# Patient Record
Sex: Male | Born: 1960
Health system: Southern US, Community
[De-identification: ages and names within clinical notes are randomized; demographics above are authoritative.]

## PROBLEM LIST (undated history)

## (undated) DIAGNOSIS — R51 Headache: Secondary | ICD-10-CM

## (undated) DIAGNOSIS — R519 Headache, unspecified: Secondary | ICD-10-CM

## (undated) DIAGNOSIS — K219 Gastro-esophageal reflux disease without esophagitis: Secondary | ICD-10-CM

## (undated) HISTORY — PX: KIDNEY STONE SURGERY: SHX686

---
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
## 1990-11-23 HISTORY — PX: VASECTOMY: SHX75

## 2006-07-22 ENCOUNTER — Ambulatory Visit: Payer: Self-pay | Admitting: Urology

## 2010-04-01 ENCOUNTER — Ambulatory Visit: Payer: Self-pay | Admitting: Gastroenterology

## 2010-04-01 LAB — HM COLONOSCOPY: HM Colonoscopy: NORMAL

## 2010-11-03 ENCOUNTER — Ambulatory Visit: Payer: Self-pay | Admitting: General Surgery

## 2010-11-03 HISTORY — PX: HERNIA REPAIR: SHX51

## 2011-11-24 HISTORY — PX: COLONOSCOPY: SHX174

## 2015-08-14 ENCOUNTER — Encounter: Payer: Self-pay | Admitting: Family Medicine

## 2016-01-06 ENCOUNTER — Encounter: Payer: Self-pay | Admitting: Family Medicine

## 2016-01-06 ENCOUNTER — Ambulatory Visit (INDEPENDENT_AMBULATORY_CARE_PROVIDER_SITE_OTHER): Payer: BLUE CROSS/BLUE SHIELD | Admitting: Family Medicine

## 2016-01-06 VITALS — BP 116/76 | HR 62 | Temp 96.9°F | Ht 67.6 in | Wt 176.0 lb

## 2016-01-06 DIAGNOSIS — Z0000 Encounter for general adult medical examination without abnormal findings: Secondary | ICD-10-CM | POA: Diagnosis not present

## 2016-01-06 DIAGNOSIS — K4090 Unilateral inguinal hernia, without obstruction or gangrene, not specified as recurrent: Secondary | ICD-10-CM | POA: Diagnosis not present

## 2016-01-06 DIAGNOSIS — Z23 Encounter for immunization: Secondary | ICD-10-CM | POA: Diagnosis not present

## 2016-01-06 DIAGNOSIS — Z113 Encounter for screening for infections with a predominantly sexual mode of transmission: Secondary | ICD-10-CM | POA: Diagnosis not present

## 2016-01-06 LAB — UA/M W/RFLX CULTURE, ROUTINE
Bilirubin, UA: NEGATIVE
Glucose, UA: NEGATIVE
Ketones, UA: NEGATIVE
Leukocytes, UA: NEGATIVE
Nitrite, UA: NEGATIVE
Protein, UA: NEGATIVE
RBC, UA: NEGATIVE
Specific Gravity, UA: 1.025 (ref 1.005–1.030)
Urobilinogen, Ur: 0.2 mg/dL (ref 0.2–1.0)
pH, UA: 6 (ref 5.0–7.5)

## 2016-01-06 NOTE — Progress Notes (Signed)
BP 116/76 mmHg  Pulse 62  Temp(Src) 96.9 F (36.1 C)  Ht 5' 7.6" (1.717 m)  Wt 176 lb (79.833 kg)  BMI 27.08 kg/m2  SpO2 98%   Subjective:    Patient ID: Thomas Whitney, male    DOB: [birth year], 55 y.o.   MRN: [id number]  HPI: Thomas Whitney is a 55 y.o. male presenting on 01/06/2016 for comprehensive medical examination. Current medical complaints include:   HERNIA Duration: months Location: left groin Painful: no Discomfort: no Bulge: yes Quality:  Mild aching Onset: gradual Context: not changing Aggravating factors: valsalva, lifting, coughing and sneezing  He currently lives with: spouse Interim Problems from his last visit: no  Depression Screen done today and results listed below:  Depression screen [facility] 2/9 01/06/2016  Decreased Interest 0  Down, Depressed, Hopeless 0  PHQ - 2 Score 0    The patient does not have a history of falls. I did not complete a risk assessment for falls. A plan of care for falls was not documented.  Past Medical History:  History reviewed. No pertinent past medical history.  Surgical History:  Past Surgical History  Procedure Laterality Date  . Tonsillectomy    . Vasectomy    . Hernia repair    . Kidney stone surgery      X 3   Medications:  No current outpatient prescriptions on file prior to visit.   No current facility-administered medications on file prior to visit.   Allergies:  No Known Allergies  Social History:  Social History   Social History  . Marital Status: Married    Spouse Name: N/A  . Number of Children: N/A  . Years of Education: N/A   Occupational History  . Not on file.   Social History Main Topics  . Smoking status: Former Games developer  . Smokeless tobacco: Current User    Types: Snuff  . Alcohol Use: Yes     Comment: on occasion  . Drug Use: No  . Sexual Activity: Yes   Other Topics Concern  . Not on file   Social History Narrative  . No narrative on file   History  Smoking status  .  Former Smoker  Smokeless tobacco  . Biochemist, clinical  . Types: Snuff   History  Alcohol Use  . Yes    Comment: on occasion   Family History:  Family History  Problem Relation Age of Onset  . Dementia Mother   . Hypertension Mother   . Diabetes Father   . Diabetes Maternal Grandfather   . Diabetes Paternal Grandfather     Past medical history, surgical history, medications, allergies, family history and social history reviewed with patient today and changes made to appropriate areas of the chart.   Review of Systems  Constitutional: Negative.   HENT: Negative.   Eyes: Negative.   Respiratory: Negative.   Cardiovascular: Negative.   Gastrointestinal: Positive for heartburn (goes away with prilosec). Negative for nausea, vomiting, abdominal pain, diarrhea, constipation, blood in stool and melena.  Genitourinary: Negative.   Musculoskeletal: Negative.   Skin: Negative.   Neurological: Negative.   Endo/Heme/Allergies: Negative.   Psychiatric/Behavioral: Negative.     All other ROS negative except what is listed above and in the HPI.      Objective:    BP 116/76 mmHg  Pulse 62  Temp(Src) 96.9 F (36.1 C)  Ht 5' 7.6" (1.717 m)  Wt 176 lb (79.833 kg)  BMI 27.08 kg/m2  SpO2 98%  Wt Readings from Last 3 Encounters:  01/06/16 176 lb (79.833 kg)    Physical Exam  Constitutional: He is oriented to person, place, and time. He appears well-developed and well-nourished. No distress.  HENT:  Head: Normocephalic and atraumatic.  Right Ear: Hearing, tympanic membrane, external ear and ear canal normal.  Left Ear: Hearing, tympanic membrane, external ear and ear canal normal.  Nose: Nose normal.  Mouth/Throat: Uvula is midline, oropharynx is clear and moist and mucous membranes are normal. No oropharyngeal exudate.  Eyes: Conjunctivae, EOM and lids are normal. Pupils are equal, round, and reactive to light. Right eye exhibits no discharge. Left eye exhibits no discharge. No  scleral icterus.  Neck: No JVD present. No tracheal deviation present. No thyromegaly present.  Cardiovascular: Normal rate, regular rhythm, normal heart sounds and intact distal pulses.  Exam reveals no gallop and no friction rub.   No murmur heard. Pulmonary/Chest: Effort normal and breath sounds normal. No stridor. No respiratory distress. He has no wheezes. He has no rales. He exhibits no tenderness.  Abdominal: Soft. Bowel sounds are normal. He exhibits no distension and no mass. There is no tenderness. There is no rebound and no guarding. A hernia is present. Hernia confirmed positive in the left inguinal area. Hernia confirmed negative in the right inguinal area.  Genitourinary: Testes normal and penis normal. No phimosis, paraphimosis, hypospadias, penile erythema or penile tenderness. No discharge found.  Musculoskeletal: Normal range of motion. He exhibits no edema or tenderness.  Lymphadenopathy:    He has no cervical adenopathy.       Right: No inguinal adenopathy present.       Left: No inguinal adenopathy present.  Neurological: He is alert and oriented to person, place, and time. He has normal reflexes. He displays normal reflexes. No cranial nerve deficit. He exhibits normal muscle tone. Coordination normal.  Skin: Skin is warm, dry and intact. No rash noted. He is not diaphoretic. No erythema. No pallor.  Psychiatric: He has a normal mood and affect. His speech is normal and behavior is normal. Judgment and thought content normal. Cognition and memory are normal.  Nursing note and vitals reviewed.   Results for orders placed or performed in visit on 01/06/16  HM COLONOSCOPY  Result Value Ref Range   HM Colonoscopy Normal, repeat 10years       Assessment & Plan:   Problem List Items Addressed This Visit      Other   Left inguinal hernia    Has seen Dr. Lemar Livings in the past for his R hernia repair. Will get him back in to see them for possible hernia repair. Referral  generated today.      Relevant Orders   Ambulatory referral to General Surgery    Other Visit Diagnoses    Routine general medical examination at a health care facility    -  Primary    Up to date on vaccines and colonoscopy. Screening labs checked today. Work on diet and exercise. Continue to monitor. Would like zoster next year.     Relevant Orders    CBC with Differential/Platelet    Comprehensive metabolic panel    Lipid Panel w/o Chol/HDL Ratio    TSH    PSA    UA/M w/rflx Culture, Routine    Screening for STD (sexually transmitted disease)        No risk. Drawn today. Await results.     Relevant Orders    HIV antibody    Immunization  due        Relevant Orders    Pneumococcal polysaccharide vaccine 23-valent greater than or equal to 2yo subcutaneous/IM (Completed)        Discussed aspirin prophylaxis for myocardial infarction prevention and decision was it was not indicated  LABORATORY TESTING:  Health maintenance labs ordered today as discussed above.   The natural history of prostate cancer and ongoing controversy regarding screening and potential treatment outcomes of prostate cancer has been discussed with the patient. The meaning of a false positive PSA and a false negative PSA has been discussed. He indicates understanding of the limitations of this screening test and wishes to proceed with screening PSA testing.   IMMUNIZATIONS:   - Tdap: Tetanus vaccination status reviewed: last tetanus booster within 10 years. - Influenza: Up to date - Pneumovax: Administered today - Prevnar: Not applicable - Zostavax vaccine: Will do it next year  SCREENING: - Colonoscopy: Up to date  Discussed with patient purpose of the colonoscopy is to detect colon cancer at curable precancerous or early stages   PATIENT COUNSELING:    Sexuality: Discussed sexually transmitted diseases, partner selection, use of condoms, avoidance of unintended pregnancy  and contraceptive  alternatives.   Advised to avoid cigarette smoking.  I discussed with the patient that most people either abstain from alcohol or drink within safe limits (<=14/week and <=4 drinks/occasion for males, <=7/weeks and <= 3 drinks/occasion for females) and that the risk for alcohol disorders and other health effects rises proportionally with the number of drinks per week and how often a drinker exceeds daily limits.  Discussed cessation/primary prevention of drug use and availability of treatment for abuse.   Diet: Encouraged to adjust caloric intake to maintain  or achieve ideal body weight, to reduce intake of dietary saturated fat and total fat, to limit sodium intake by avoiding high sodium foods and not adding table salt, and to maintain adequate dietary potassium and calcium preferably from fresh fruits, vegetables, and low-fat dairy products.    stressed the importance of regular exercise  Injury prevention: Discussed safety belts, safety helmets, smoke detector, smoking near bedding or upholstery.   Dental health: Discussed importance of regular tooth brushing, flossing, and dental visits.   Follow up plan: NEXT PREVENTATIVE PHYSICAL DUE IN 1 YEAR. Return in about 1 year (around 01/05/2017) for Physical.

## 2016-01-06 NOTE — Patient Instructions (Signed)

## 2016-01-06 NOTE — Assessment & Plan Note (Signed)
Has seen Dr. Lemar Livings in the past for his R hernia repair. Will get him back in to see them for possible hernia repair. Referral generated today.

## 2016-01-07 ENCOUNTER — Encounter: Payer: Self-pay | Admitting: Family Medicine

## 2016-01-07 LAB — LIPID PANEL W/O CHOL/HDL RATIO
Cholesterol, Total: 198 mg/dL (ref 100–199)
HDL: 66 mg/dL (ref >39)
LDL Calculated: 122 mg/dL — ABNORMAL HIGH (ref 0–99)
Triglycerides: 50 mg/dL (ref 0–149)
VLDL Cholesterol Cal: 10 mg/dL (ref 5–40)

## 2016-01-07 LAB — COMPREHENSIVE METABOLIC PANEL
ALT: 14 IU/L (ref 0–44)
AST: 17 IU/L (ref 0–40)
Albumin/Globulin Ratio: 1.8 (ref 1.1–2.5)
Albumin: 4.4 g/dL (ref 3.5–5.5)
Alkaline Phosphatase: 45 IU/L (ref 39–117)
BUN/Creatinine Ratio: 15 (ref 9–20)
BUN: 14 mg/dL (ref 6–24)
Bilirubin Total: 0.5 mg/dL (ref 0.0–1.2)
CO2: 26 mmol/L (ref 18–29)
Calcium: 9.1 mg/dL (ref 8.7–10.2)
Chloride: 103 mmol/L (ref 96–106)
Creatinine, Ser: 0.96 mg/dL (ref 0.76–1.27)
GFR calc Af Amer: 101 mL/min/{1.73_m2} (ref >59)
GFR calc non Af Amer: 87 mL/min/{1.73_m2} (ref >59)
Globulin, Total: 2.4 g/dL (ref 1.5–4.5)
Glucose: 110 mg/dL — ABNORMAL HIGH (ref 65–99)
Potassium: 4.9 mmol/L (ref 3.5–5.2)
Sodium: 144 mmol/L (ref 134–144)
Total Protein: 6.8 g/dL (ref 6.0–8.5)

## 2016-01-07 LAB — CBC WITH DIFFERENTIAL/PLATELET
Basophils Absolute: 0 10*3/uL (ref 0.0–0.2)
Basos: 1 %
EOS (ABSOLUTE): 0.1 10*3/uL (ref 0.0–0.4)
Eos: 2 %
Hematocrit: 42.4 % (ref 37.5–51.0)
Hemoglobin: 14.2 g/dL (ref 12.6–17.7)
Immature Grans (Abs): 0 10*3/uL (ref 0.0–0.1)
Immature Granulocytes: 0 %
Lymphocytes Absolute: 1.7 10*3/uL (ref 0.7–3.1)
Lymphs: 36 %
MCH: 31.1 pg (ref 26.6–33.0)
MCHC: 33.5 g/dL (ref 31.5–35.7)
MCV: 93 fL (ref 79–97)
Monocytes Absolute: 0.7 10*3/uL (ref 0.1–0.9)
Monocytes: 14 %
Neutrophils Absolute: 2.2 10*3/uL (ref 1.4–7.0)
Neutrophils: 47 %
Platelets: 258 10*3/uL (ref 150–379)
RBC: 4.56 x10E6/uL (ref 4.14–5.80)
RDW: 13.6 % (ref 12.3–15.4)
WBC: 4.6 10*3/uL (ref 3.4–10.8)

## 2016-01-07 LAB — PSA: Prostate Specific Ag, Serum: 0.8 ng/mL (ref 0.0–4.0)

## 2016-01-07 LAB — HIV ANTIBODY (ROUTINE TESTING W REFLEX): HIV Screen 4th Generation wRfx: NONREACTIVE

## 2016-01-07 LAB — TSH: TSH: 1.18 u[IU]/mL (ref 0.450–4.500)

## 2016-01-08 ENCOUNTER — Encounter: Payer: Self-pay | Admitting: General Surgery

## 2016-01-09 ENCOUNTER — Ambulatory Visit (INDEPENDENT_AMBULATORY_CARE_PROVIDER_SITE_OTHER): Payer: BLUE CROSS/BLUE SHIELD | Admitting: General Surgery

## 2016-01-09 ENCOUNTER — Encounter: Payer: Self-pay | Admitting: General Surgery

## 2016-01-09 VITALS — BP 120/78 | HR 74 | Resp 12 | Ht 68.0 in | Wt 186.0 lb

## 2016-01-09 DIAGNOSIS — K4090 Unilateral inguinal hernia, without obstruction or gangrene, not specified as recurrent: Secondary | ICD-10-CM

## 2016-01-09 NOTE — Progress Notes (Signed)
Patient ID: Thomas Whitney, male   DOB: Jul 04, 1961, 55 y.o.   MRN: [id number]  Chief Complaint  Patient presents with  . Hernia    HPI [name] is a 55 y.o. male.  Here today for evaluation of a possible left  inguinal hernia. He noticed this area about three years ago. No change in size, just started noting some pitching discomfort about a month ago. Right inguinal hernia repair done in 2011. No problems in this area.  The patient denies any new GI or GU symptoms.  I personally reviewed the patient's history.    HPI  No past medical history on file.  Past Surgical History  Procedure Laterality Date  . Tonsillectomy  1966  . Kidney stone surgery      2001, 2006  . Vasectomy  1992  . Colonoscopy  2013  . Hernia repair Right 11/03/2010    Direct inguinal hernia, large Ultra Pro mesh.    Family History  Problem Relation Age of Onset  . Dementia Mother   . Hypertension Mother   . Diabetes Father   . Diabetes Maternal Grandfather   . Diabetes Paternal Grandfather     Social History Social History  Substance Use Topics  . Smoking status: Former Smoker -- 20 years  . Smokeless tobacco: Current User    Types: Snuff  . Alcohol Use: 0.0 oz/week    0 Standard drinks or equivalent per week     Comment: on occasion    No Known Allergies  No current outpatient prescriptions on file.   No current facility-administered medications for this visit.    Review of Systems Review of Systems  Constitutional: Negative.   Respiratory: Negative.   Cardiovascular: Negative.     Blood pressure 120/78, pulse 74, resp. rate 12, height  (1.727 m), weight 186 lb (84.369 kg).  Physical Exam Physical Exam  Constitutional: He is oriented to person, place, and time. He appears well-developed and well-nourished.  Eyes: Conjunctivae are normal. No scleral icterus.  Neck: Neck supple.  Cardiovascular: Normal rate and normal heart sounds.   Pulmonary/Chest: Effort normal  and breath sounds normal.  Abdominal: Soft. Normal appearance. A hernia is present. Hernia confirmed positive in the left inguinal area.  Lymphadenopathy:    He has no cervical adenopathy.  Neurological: He is alert and oriented to person, place, and time.  Skin: Skin is warm and dry.    Data Reviewed PCP notes of 01/06/2016 reviewed.  CBC, comp antimetabolic panel, PSA and urinalysis of same date reviewed. Modest elevation a fasting blood sugar 110 noted. Otherwise all within normal limits.  Assessment    Symptomatic left inguinal hernia.    Plan    Indication for repair reviewed.      Hernia precautions and incarceration were discussed with the patient. If they develop symptoms of an incarcerated hernia, they were encouraged to seek prompt medical attention.  I have recommended repair of the hernia using mesh on an outpatient basis in the near future. The risk of infection was reviewed. The role of prosthetic mesh to minimize the risk of recurrence was reviewed.  Patient's surgery has been scheduled for 02-10-16 at [facility].  PCP:  Olevia Perches P This information has been scribed by Ples Specter CMA.   Earline Mayotte 01/10/2016, 9:13 AM

## 2016-01-09 NOTE — Patient Instructions (Addendum)

## 2016-01-10 ENCOUNTER — Encounter: Payer: Self-pay | Admitting: General Surgery

## 2016-01-27 ENCOUNTER — Telehealth: Payer: Self-pay | Admitting: *Deleted

## 2016-01-27 NOTE — Telephone Encounter (Signed)
Patient contacted today and confirms he is not taking any medications at this time.   Also, confirms he has no new health issues since his appointment last month.   We will proceed with hernia surgery as scheduled for 02-10-16 at [facility].

## 2016-02-03 ENCOUNTER — Other Ambulatory Visit: Payer: Self-pay

## 2016-02-03 ENCOUNTER — Encounter: Payer: Self-pay | Admitting: *Deleted

## 2016-02-03 NOTE — Patient Instructions (Signed)
  Your procedure is scheduled on: 02-10-16 Report to [facility] 2ND FLOOR To find out your arrival time please call [phone] between 1PM - 3PM on 02-07-16   Remember: Instructions that are not followed completely may result in serious medical risk, up to and including death, or upon the discretion of your surgeon and anesthesiologist your surgery may need to be rescheduled.    _X___ 1. Do not eat food or drink liquids after midnight. No gum chewing or hard candies.     _X___ 2. No Alcohol for 24 hours before or after surgery.   ____ 3. Bring all medications with you on the day of surgery if instructed.    ____ 4. Notify your doctor if there is any change in your medical condition     (cold, fever, infections).     Do not wear jewelry, make-up, hairpins, clips or nail polish.  Do not wear lotions, powders, or perfumes. You may wear deodorant.  Do not shave 48 hours prior to surgery. Men may shave face and neck.  Do not bring valuables to the hospital.    [facility] not responsible for any belongings or valuables.               Contacts, dentures or bridgework may not be worn into surgery.  Leave your suitcase in the car. After surgery it may be brought to your room.  For patients admitted to the hospital, discharge time is determined by your treatment team.   Patients discharged the day of surgery will not be allowed to drive home.   Please read over the following fact sheets that you were given:    _X___ Take these medicines the morning of surgery with A SIP OF WATER:    1. PRILOSEC  2. TAKE PRILOSEC ON Sunday NIGHT (02-09-16) AT BEDTIME  3.   4.  5.  6.  ____ Fleet Enema (as directed)   ____ Use CHG Soap as directed  ____ Use inhalers on the day of surgery  ____ Stop metformin 2 days prior to surgery    ____ Take 1/2 of usual insulin dose the night before surgery and none on the morning of surgery.   ____ Stop  Coumadin/Plavix/aspirin-N/A  ____ Stop Anti-inflammatories-N/A   ____ Stop supplements until after surgery.    ____ Bring C-Pap to the hospital.

## 2016-02-10 ENCOUNTER — Ambulatory Visit
Admission: RE | Admit: 2016-02-10 | Discharge: 2016-02-10 | Disposition: A | Discharge status: Home / Self Care | Payer: BLUE CROSS/BLUE SHIELD | Source: Ambulatory Visit | Attending: General Surgery | Admitting: General Surgery

## 2016-02-10 ENCOUNTER — Encounter
Admission: RE | Disposition: A | Discharge status: Home / Self Care | Payer: Self-pay | Source: Ambulatory Visit | Attending: General Surgery

## 2016-02-10 ENCOUNTER — Encounter: Payer: Self-pay | Admitting: *Deleted

## 2016-02-10 ENCOUNTER — Ambulatory Visit: Payer: BLUE CROSS/BLUE SHIELD | Admitting: Anesthesiology

## 2016-02-10 DIAGNOSIS — K4090 Unilateral inguinal hernia, without obstruction or gangrene, not specified as recurrent: Secondary | ICD-10-CM

## 2016-02-10 DIAGNOSIS — Z9852 Vasectomy status: Secondary | ICD-10-CM | POA: Diagnosis not present

## 2016-02-10 DIAGNOSIS — F17210 Nicotine dependence, cigarettes, uncomplicated: Secondary | ICD-10-CM | POA: Diagnosis not present

## 2016-02-10 DIAGNOSIS — Z79899 Other long term (current) drug therapy: Secondary | ICD-10-CM | POA: Diagnosis not present

## 2016-02-10 DIAGNOSIS — K219 Gastro-esophageal reflux disease without esophagitis: Secondary | ICD-10-CM | POA: Diagnosis not present

## 2016-02-10 DIAGNOSIS — Z87442 Personal history of urinary calculi: Secondary | ICD-10-CM | POA: Insufficient documentation

## 2016-02-10 DIAGNOSIS — Z9889 Other specified postprocedural states: Secondary | ICD-10-CM | POA: Diagnosis not present

## 2016-02-10 HISTORY — DX: Headache, unspecified: R51.9

## 2016-02-10 HISTORY — DX: Gastro-esophageal reflux disease without esophagitis: K21.9

## 2016-02-10 HISTORY — DX: Headache: R51

## 2016-02-10 HISTORY — PX: INGUINAL HERNIA REPAIR: SUR1180

## 2016-02-10 HISTORY — PX: INGUINAL HERNIA REPAIR: SHX194

## 2016-02-10 SURGERY — REPAIR, HERNIA, INGUINAL, ADULT
Anesthesia: General | Laterality: Left | Wound class: Clean

## 2016-02-10 MED ORDER — BUPIVACAINE-EPINEPHRINE (PF) 0.5% -1:200000 IJ SOLN
INTRAMUSCULAR | Status: DC | PRN
  Administered 2016-02-10: 30 mL

## 2016-02-10 MED ORDER — ONDANSETRON HCL 4 MG/2ML IJ SOLN
INTRAMUSCULAR | Status: DC | PRN
  Administered 2016-02-10: 4 mg via INTRAVENOUS

## 2016-02-10 MED ORDER — LIDOCAINE HCL (CARDIAC) 20 MG/ML IV SOLN
INTRAVENOUS | Status: DC | PRN
  Administered 2016-02-10: 90 mg via INTRAVENOUS

## 2016-02-10 MED ORDER — BUPIVACAINE-EPINEPHRINE (PF) 0.5% -1:200000 IJ SOLN
INTRAMUSCULAR | Status: AC
  Filled 2016-02-10: qty 30

## 2016-02-10 MED ORDER — MIDAZOLAM HCL 2 MG/2ML IJ SOLN
INTRAMUSCULAR | Status: DC | PRN
  Administered 2016-02-10: 1 mg via INTRAVENOUS

## 2016-02-10 MED ORDER — LACTATED RINGERS IV SOLN
INTRAVENOUS | Status: DC
  Administered 2016-02-10 (×2): via INTRAVENOUS

## 2016-02-10 MED ORDER — DEXAMETHASONE SODIUM PHOSPHATE 10 MG/ML IJ SOLN
INTRAMUSCULAR | Status: DC | PRN
  Administered 2016-02-10: 10 mg via INTRAVENOUS

## 2016-02-10 MED ORDER — FENTANYL CITRATE (PF) 100 MCG/2ML IJ SOLN
INTRAMUSCULAR | Status: DC | PRN
  Administered 2016-02-10 (×2): 50 ug via INTRAVENOUS

## 2016-02-10 MED ORDER — FENTANYL CITRATE (PF) 100 MCG/2ML IJ SOLN
25.0000 ug | INTRAMUSCULAR | Status: DC | PRN
Start: 2016-02-10 — End: 2016-02-10

## 2016-02-10 MED ORDER — PROPOFOL 10 MG/ML IV BOLUS
INTRAVENOUS | Status: DC | PRN
  Administered 2016-02-10: 150 mg via INTRAVENOUS

## 2016-02-10 MED ORDER — HYDROCODONE-ACETAMINOPHEN 5-325 MG PO TABS: 1.0000 | ORAL_TABLET | ORAL | Status: DC | PRN

## 2016-02-10 MED ORDER — CEFAZOLIN SODIUM-DEXTROSE 2-3 GM-% IV SOLR
2.0000 g | Freq: Once | INTRAVENOUS | Status: AC
  Administered 2016-02-10 (×2): 2 g via INTRAVENOUS

## 2016-02-10 MED ORDER — ONDANSETRON HCL 4 MG/2ML IJ SOLN: 4.0000 mg | Freq: Once | INTRAMUSCULAR | Status: DC | PRN

## 2016-02-10 MED ORDER — CEFAZOLIN SODIUM-DEXTROSE 2-3 GM-% IV SOLR
INTRAVENOUS | Status: AC
  Filled 2016-02-10: qty 50

## 2016-02-10 SURGICAL SUPPLY — 33 items
BLADE SURG 15 STRL SS SAFETY (BLADE) ×2 IMPLANT
CANISTER SUCT 1200ML W/VALVE (MISCELLANEOUS) ×2 IMPLANT
CHLORAPREP W/TINT 26ML (MISCELLANEOUS) ×2 IMPLANT
DECANTER SPIKE VIAL GLASS SM (MISCELLANEOUS) IMPLANT
DRAIN PENROSE 1/4X12 LTX (DRAIN) ×2 IMPLANT
DRAPE LAPAROTOMY 100X77 ABD (DRAPES) ×2 IMPLANT
DRESSING TELFA 4X3 1S ST N-ADH (GAUZE/BANDAGES/DRESSINGS) ×2 IMPLANT
DRSG TEGADERM 4X4.75 (GAUZE/BANDAGES/DRESSINGS) ×2 IMPLANT
ELECT REM PT RETURN 9FT ADLT (ELECTROSURGICAL) ×2
ELECTRODE REM PT RTRN 9FT ADLT (ELECTROSURGICAL) ×1 IMPLANT
GLOVE BIO SURGEON STRL SZ7.5 (GLOVE) ×6 IMPLANT
GLOVE INDICATOR 8.0 STRL GRN (GLOVE) ×4 IMPLANT
GOWN STRL REUS W/ TWL LRG LVL3 (GOWN DISPOSABLE) ×2 IMPLANT
GOWN STRL REUS W/TWL LRG LVL3 (GOWN DISPOSABLE) ×2
KIT RM TURNOVER STRD PROC AR (KITS) ×2 IMPLANT
LABEL OR SOLS (LABEL) ×2 IMPLANT
MESH HERNIA 6X12 ULTRAPRO MED (Mesh General) ×1 IMPLANT
MESH HERNIA ULTRAPRO MED (Mesh General) ×1 IMPLANT
NDL SAFETY 22GX1.5 (NEEDLE) ×4 IMPLANT
NEEDLE HYPO 25X1 1.5 SAFETY (NEEDLE) IMPLANT
PACK BASIN MINOR ARMC (MISCELLANEOUS) ×2 IMPLANT
STRIP CLOSURE SKIN 1/2X4 (GAUZE/BANDAGES/DRESSINGS) ×2 IMPLANT
SUT SURGILON 0 BLK (SUTURE) ×2 IMPLANT
SUT VIC AB 2-0 SH 27 (SUTURE) ×1
SUT VIC AB 2-0 SH 27XBRD (SUTURE) ×1 IMPLANT
SUT VIC AB 3-0 54X BRD REEL (SUTURE) ×1 IMPLANT
SUT VIC AB 3-0 BRD 54 (SUTURE) ×1
SUT VIC AB 3-0 SH 27 (SUTURE) ×1
SUT VIC AB 3-0 SH 27X BRD (SUTURE) ×1 IMPLANT
SUT VIC AB 4-0 FS2 27 (SUTURE) ×2 IMPLANT
SWABSTK COMLB BENZOIN TINCTURE (MISCELLANEOUS) ×2 IMPLANT
SYR 3ML LL SCALE MARK (SYRINGE) ×2 IMPLANT
SYR CONTROL 10ML (SYRINGE) ×2 IMPLANT

## 2016-02-10 NOTE — Discharge Instructions (Signed)

## 2016-02-10 NOTE — H&P (Signed)
Thomas Whitney [id number] [birth year]     HPI: 55 y/o male with symptomatic left inguinal hernia.   Prescriptions prior to admission  Medication Sig Dispense Refill Last Dose  . omeprazole (PRILOSEC) 20 MG capsule Take 20 mg by mouth as needed.   02/10/2016 at 0430   No Known Allergies Past Medical History  Diagnosis Date  . GERD (gastroesophageal reflux disease)   . Headache    Past Surgical History  Procedure Laterality Date  . Tonsillectomy  1966  . Kidney stone surgery      2001, 2006  . Vasectomy  1992  . Colonoscopy  2013  . Hernia repair Right 11/03/2010    Direct inguinal hernia, large Ultra Pro mesh.   Social History   Social History  . Marital Status: Married    Spouse Name: N/A  . Number of Children: N/A  . Years of Education: N/A   Occupational History  . Not on file.   Social History Main Topics  . Smoking status: Current Some Day Smoker -- 20 years    Types: Cigarettes  . Smokeless tobacco: Current User    Types: Snuff  . Alcohol Use: 0.0 oz/week    0 Standard drinks or equivalent per week     Comment: on occasion  . Drug Use: No  . Sexual Activity: Yes   Other Topics Concern  . Not on file   Social History Narrative   Social History   Social History Narrative     ROS: Negative.     PE: HEENT: Negative. Lungs: Clear. Cardio: RR. Abd: + LIH Earline MayotteByrnett, Jeffrey W 02/10/2016   Assessment/Plan:  Proceed with planned left inguinal hernia repair.

## 2016-02-10 NOTE — Anesthesia Preprocedure Evaluation (Signed)
Anesthesia Evaluation  Patient identified by MRN, date of birth, ID band Patient awake    Reviewed: Allergy & Precautions, NPO status , Patient's Chart, lab work & pertinent test results, reviewed documented beta blocker date and time   Airway Mallampati: II  TM Distance: >3 FB     Dental  (+) Chipped   Pulmonary Current Smoker,           Cardiovascular      Neuro/Psych  Headaches,    GI/Hepatic GERD  Controlled and Medicated,  Endo/Other    Renal/GU      Musculoskeletal   Abdominal   Peds  Hematology   Anesthesia Other Findings   Reproductive/Obstetrics                             Anesthesia Physical Anesthesia Plan  ASA: II  Anesthesia Plan: General   Post-op Pain Management:    Induction: Intravenous  Airway Management Planned: LMA  Additional Equipment:   Intra-op Plan:   Post-operative Plan:   Informed Consent: I have reviewed the patients History and Physical, chart, labs and discussed the procedure including the risks, benefits and alternatives for the proposed anesthesia with the patient or authorized representative who has indicated his/her understanding and acceptance.     Plan Discussed with: CRNA  Anesthesia Plan Comments:         Anesthesia Quick Evaluation

## 2016-02-10 NOTE — Op Note (Signed)
Preoperative diagnosis: Left inguinal hernia.  Postoperative diagnosis: Same, direct.  Operative procedure: Left inguinal hernia repair with Ultra Pro mesh.  Operating surgeon: Lane HackerJeffery Byrnett, M.D.  Anesthesia: Gen. by LMA, Marcaine 0.5% with 1-200,000 units of epinephrine, 30 mL local infiltration, Toradol 30 mg.  Clinical note: This 55 year old male has developed a symptomatic left inguinal hernia. He was better for elective repair. Hair was removed from the surgical site prior to the procedure. He received Kefzol 2 g intravenously.  Operative note: The patient underwent general anesthesia without difficulty. The abdomen was prepped with ChloraPrep and draped. A 5 cm skin line incision was carried down to the skin subtendinous tissue with hemostasis achieved by electrocautery. The external oblique was opened in the direction of its fibers. Field block anesthesia was established at the beginning of the procedure for postoperative comfort. The cord was mobilized. Dissection at the level of the internal ring showed a small lipoma of the cord which was ligated with a 3-0 Vicryl tie excised and discarded. There was no evidence of an indirect sac. There was a moderate size direct hernia. This was unroofed in the preperitoneal space cleared. A medium ultra Pro mesh was smoothed into the preperitoneal space and the external component smooth along the inguinal canal. This was anchored to the pubic tubercle with 0 Surgilon. The inferior border was anchored to the inguinal ligament with interrupted sutures. The superior and medial borders were anchored to the transverse abdominis aponeurosis in a similar fashion. The iliohypogastric nerve had been identified and protected. This was returned to its bed. Toradol was placed in the wound. The external Bleich was closed with a running 2-0 Vicryl suture. Scarpa's fascia was closed with a running 3-0 Vicryl suture and the skin closed with a running 4-0 Vicryl septic  suture. Benzoin, Steri-Strips, Telfa and Tegaderm dressing was applied.  The patient tolerated the procedure well was brought to the recovery room in stable condition.

## 2016-02-10 NOTE — Anesthesia Postprocedure Evaluation (Signed)
Anesthesia Post Note  Patient: Thomas Whitney  Procedure(s) Performed: Procedure(s) (LRB): HERNIA REPAIR INGUINAL ADULT (Left)  Patient location during evaluation: PACU Anesthesia Type: General Level of consciousness: awake and alert Pain management: pain level controlled Vital Signs Assessment: post-procedure vital signs reviewed and stable Respiratory status: spontaneous breathing, nonlabored ventilation, respiratory function stable and patient connected to nasal cannula oxygen Cardiovascular status: blood pressure returned to baseline and stable Postop Assessment: no signs of nausea or vomiting Anesthetic complications: no    Last Vitals:  Filed Vitals:   02/10/16 0934 02/10/16 1008  BP: 133/83 126/66  Pulse: 58 60  Temp:    Resp: 17 17    Last Pain:  Filed Vitals:   02/10/16 1009  PainSc: 0-No pain                 THOMAS,MATHAI S

## 2016-02-10 NOTE — Transfer of Care (Signed)
Immediate Anesthesia Transfer of Care Note  Patient: Thomas Whitney  Procedure(s) Performed: Procedure(s): HERNIA REPAIR INGUINAL ADULT (Left)  Patient Location: PACU  Anesthesia Type:General  Level of Consciousness: awake  Airway & Oxygen Therapy: Patient Spontanous Breathing  Post-op Assessment: Report given to RN  Post vital signs: Reviewed  Last Vitals:  Filed Vitals:   02/10/16 0610 02/10/16 0826  BP: 129/85 145/73  Pulse: 57 80  Temp: 36.1 C 36.2 C  Resp: 18 18    Complications: No apparent anesthesia complications

## 2016-02-19 ENCOUNTER — Ambulatory Visit (INDEPENDENT_AMBULATORY_CARE_PROVIDER_SITE_OTHER): Payer: BLUE CROSS/BLUE SHIELD | Admitting: General Surgery

## 2016-02-19 ENCOUNTER — Encounter: Payer: Self-pay | Admitting: General Surgery

## 2016-02-19 VITALS — BP 128/78 | HR 76 | Resp 12 | Ht 68.0 in | Wt 184.0 lb

## 2016-02-19 DIAGNOSIS — K4090 Unilateral inguinal hernia, without obstruction or gangrene, not specified as recurrent: Secondary | ICD-10-CM

## 2016-02-19 NOTE — Progress Notes (Signed)
Patient ID: Thomas Whitney, male   DOB: 1961-05-22, 55 y.o.   MRN: [id number]  Chief Complaint  Patient presents with  . Routine Post Op    HPI Thomas Whitney is a 55 y.o. male here today for his post op left inguinal hernia repair done on 02/10/16. Patient states he is doing well. The patient required no narcotics after surgery. Burning/staining along the base of the scrotum on the left has gradually resolved. No difficulty with bowel or bladder function.  He worked for [employer], and has made arrangements for light duty for the next few weeks. HPI  Past Medical History  Diagnosis Date  . GERD (gastroesophageal reflux disease)   . Headache     Past Surgical History  Procedure Laterality Date  . Tonsillectomy  1966  . Kidney stone surgery      2001, 2006  . Vasectomy  1992  . Colonoscopy  2013  . Hernia repair Right 11/03/2010    Direct inguinal hernia, large Ultra Pro mesh.  . Inguinal hernia repair Left 02/10/2016    Procedure: HERNIA REPAIR INGUINAL ADULT;  Surgeon: Earline MayotteJeffrey W Byrnett, MD;  Location: [facility] ORS;  Service: General;  Laterality: Left;    Family History  Problem Relation Age of Onset  . Dementia Mother   . Hypertension Mother   . Diabetes Father   . Diabetes Maternal Grandfather   . Diabetes Paternal Grandfather     Social History Social History  Substance Use Topics  . Smoking status: Current Some Day Smoker -- 20 years    Types: Cigarettes  . Smokeless tobacco: Current User    Types: Snuff  . Alcohol Use: 0.0 oz/week    0 Standard drinks or equivalent per week     Comment: on occasion    No Known Allergies  Current Outpatient Prescriptions  Medication Sig Dispense Refill  . omeprazole (PRILOSEC) 20 MG capsule Take 20 mg by mouth as needed.     No current facility-administered medications for this visit.    Review of Systems Review of Systems  Constitutional: Negative.   Respiratory: Negative.   Cardiovascular: Negative.      Blood pressure 128/78, pulse 76, resp. rate 12, height 5\' 8"  (1.727 m), weight 184 lb (83.462 kg).  Physical Exam Physical Exam  Constitutional: He is oriented to person, place, and time. He appears well-developed and well-nourished.  Abdominal:  Left inguinal incision and healing well.   Neurological: He is alert and oriented to person, place, and time.  Skin: Skin is warm and dry.      Assessment    Doing well status post left inguinal hernia repair.    Plan    Proper lifting technique reviewed.   Patient to return as needed. PCP:  Laural BenesJohnson This information has been scribed by Ples SpecterJessica Qualls CMA.    Earline MayotteByrnett, Jeffrey W 02/19/2016, 5:58 PM

## 2016-02-19 NOTE — Patient Instructions (Addendum)
Patient to return as needed. 

## 2016-04-15 ENCOUNTER — Encounter: Payer: Self-pay | Admitting: General Surgery

## 2016-04-16 ENCOUNTER — Encounter: Payer: Self-pay | Admitting: General Surgery

## 2016-04-27 ENCOUNTER — Encounter: Payer: Self-pay | Admitting: General Surgery

## 2017-01-06 ENCOUNTER — Ambulatory Visit (INDEPENDENT_AMBULATORY_CARE_PROVIDER_SITE_OTHER): Payer: 59 | Admitting: Family Medicine

## 2017-01-06 ENCOUNTER — Encounter: Payer: Self-pay | Admitting: Family Medicine

## 2017-01-06 VITALS — BP 114/79 | HR 63 | Temp 98.7°F | Resp 17 | Ht 69.75 in | Wt 183.0 lb

## 2017-01-06 DIAGNOSIS — Z125 Encounter for screening for malignant neoplasm of prostate: Secondary | ICD-10-CM

## 2017-01-06 DIAGNOSIS — Z0000 Encounter for general adult medical examination without abnormal findings: Secondary | ICD-10-CM

## 2017-01-06 DIAGNOSIS — Z13220 Encounter for screening for lipoid disorders: Secondary | ICD-10-CM

## 2017-01-06 LAB — UA/M W/RFLX CULTURE, ROUTINE
Bilirubin, UA: NEGATIVE
Glucose, UA: NEGATIVE
Ketones, UA: NEGATIVE
Leukocytes, UA: NEGATIVE
Nitrite, UA: NEGATIVE
Protein, UA: NEGATIVE
Specific Gravity, UA: 1.02 (ref 1.005–1.030)
Urobilinogen, Ur: 0.2 mg/dL (ref 0.2–1.0)
pH, UA: 7 (ref 5.0–7.5)

## 2017-01-06 LAB — MICROSCOPIC EXAMINATION
Epithelial Cells (non renal): NONE SEEN /hpf (ref 0–10)
WBC, UA: NONE SEEN /hpf (ref 0–?)

## 2017-01-06 NOTE — Patient Instructions (Addendum)

## 2017-01-06 NOTE — Progress Notes (Signed)
BP 114/79 (BP Location: Left Arm, Patient Position: Sitting, Cuff Size: Normal)   Pulse 63   Temp 98.7 F (37.1 C) (Oral)   Resp 17   Ht 5' 9.75" (1.772 m)   Wt 183 lb (83 kg)   SpO2 98%   BMI 26.45 kg/m    Subjective:    Patient ID: Thomas Whitney, male    DOB: 1961-08-01, 56 y.o.   MRN: [id number]  HPI: Thomas Whitney is a 56 y.o. male presenting on 01/06/2017 for comprehensive medical examination. Current medical complaints include:none  He currently lives with: wife Interim Problems from his last visit: no  Depression Screen done today and results listed below:  Depression screen [facility] 2/9 01/06/2017 01/06/2016  Decreased Interest 0 0  Down, Depressed, Hopeless 0 0  PHQ - 2 Score 0 0    Past Medical History:  Past Medical History:  Diagnosis Date  . GERD (gastroesophageal reflux disease)   . Headache     Surgical History:  Past Surgical History:  Procedure Laterality Date  . COLONOSCOPY  2013  . HERNIA REPAIR Right 11/03/2010   Direct inguinal hernia, large Ultra Pro mesh.  . INGUINAL HERNIA REPAIR Left 02/10/2016   Procedure: HERNIA REPAIR INGUINAL ADULT;  Surgeon: Earline Mayotte, MD;  Location: [facility] ORS;  Service: General;  Laterality: Left;  . KIDNEY STONE SURGERY     2001, 2006  . TONSILLECTOMY  1966  . VASECTOMY  1992    Medications:  Current Outpatient Prescriptions on File Prior to Visit  Medication Sig  . omeprazole (PRILOSEC) 20 MG capsule Take 20 mg by mouth as needed.   No current facility-administered medications on file prior to visit.     Allergies:  No Known Allergies  Social History:  Social History   Social History  . Marital status: Married    Spouse name: N/A  . Number of children: N/A  . Years of education: N/A   Occupational History  . Not on file.   Social History Main Topics  . Smoking status: Former Smoker    Years: 20.00    Types: Cigarettes    Quit date: 01/07/2016  . Smokeless tobacco: Current User    Types:  Snuff  . Alcohol use 0.0 oz/week     Comment: on occasion  . Drug use: No  . Sexual activity: Yes   Other Topics Concern  . Not on file   Social History Narrative  . No narrative on file   History  Smoking Status  . Former Smoker  . Years: 20.00  . Types: Cigarettes  . Quit date: 01/07/2016  Smokeless Tobacco  . Current User  . Types: Snuff   History  Alcohol Use  . 0.0 oz/week    Comment: on occasion    Family History:  Family History  Problem Relation Age of Onset  . Dementia Mother   . Hypertension Mother   . Diabetes Father   . Diabetes Maternal Grandfather   . Diabetes Paternal Grandfather     Past medical history, surgical history, medications, allergies, family history and social history reviewed with patient today and changes made to appropriate areas of the chart.   Review of Systems  Constitutional: Negative.   HENT: Positive for congestion. Negative for ear discharge, ear pain, hearing loss, nosebleeds, sinus pain, sore throat and tinnitus.   Eyes: Negative.   Respiratory: Negative.  Negative for stridor.   Cardiovascular: Negative.   Gastrointestinal: Negative.   Genitourinary: Negative.  Musculoskeletal: Negative.   Skin: Negative.   Neurological: Negative.   Endo/Heme/Allergies: Negative.   Psychiatric/Behavioral: Negative.     All other ROS negative except what is listed above and in the HPI.      Objective:    BP 114/79 (BP Location: Left Arm, Patient Position: Sitting, Cuff Size: Normal)   Pulse 63   Temp 98.7 F (37.1 C) (Oral)   Resp 17   Ht 5' 9.75" (1.772 m)   Wt 183 lb (83 kg)   SpO2 98%   BMI 26.45 kg/m   Wt Readings from Last 3 Encounters:  01/06/17 183 lb (83 kg)  02/19/16 184 lb (83.5 kg)  02/10/16 179 lb (81.2 kg)    Physical Exam  Constitutional: He is oriented to person, place, and time. He appears well-developed and well-nourished. No distress.  HENT:  Head: Normocephalic and atraumatic.  Right Ear: Hearing,  tympanic membrane, external ear and ear canal normal.  Left Ear: Hearing, tympanic membrane, external ear and ear canal normal.  Nose: Nose normal.  Mouth/Throat: Uvula is midline, oropharynx is clear and moist and mucous membranes are normal. No oropharyngeal exudate.  Eyes: Conjunctivae, EOM and lids are normal. Pupils are equal, round, and reactive to light. Right eye exhibits no discharge. Left eye exhibits no discharge. No scleral icterus.  Neck: Normal range of motion. Neck supple. No JVD present. No tracheal deviation present. No thyromegaly present.  Cardiovascular: Normal rate, regular rhythm, normal heart sounds and intact distal pulses.  Exam reveals no gallop and no friction rub.   No murmur heard. Pulmonary/Chest: Effort normal and breath sounds normal. No stridor. No respiratory distress. He has no wheezes. He has no rales. He exhibits no tenderness.  Abdominal: Soft. Bowel sounds are normal. He exhibits no distension and no mass. There is no tenderness. There is no rebound and no guarding. Hernia confirmed negative in the right inguinal area and confirmed negative in the left inguinal area.  Genitourinary: Rectum normal, testes normal and penis normal. Prostate is enlarged. Prostate is not tender. Cremasteric reflex is present. Right testis shows no mass, no swelling and no tenderness. Right testis is descended. Cremasteric reflex is not absent on the right side. Left testis shows no mass, no swelling and no tenderness. Left testis is descended. Cremasteric reflex is not absent on the left side. No phimosis, paraphimosis, hypospadias, penile erythema or penile tenderness. No discharge found.  Musculoskeletal: Normal range of motion. He exhibits no edema, tenderness or deformity.  Lymphadenopathy:    He has no cervical adenopathy.  Neurological: He is alert and oriented to person, place, and time. He has normal reflexes. He displays normal reflexes. No cranial nerve deficit. He exhibits  normal muscle tone. Coordination normal.  Skin: Skin is warm, dry and intact. No rash noted. He is not diaphoretic. No erythema. No pallor.  Psychiatric: He has a normal mood and affect. His speech is normal and behavior is normal. Judgment and thought content normal. Cognition and memory are normal.  Nursing note and vitals reviewed.   Results for orders placed or performed in visit on 01/06/16  CBC with Differential/Platelet  Result Value Ref Range   WBC 4.6 3.4 - 10.8 x10E3/uL   RBC 4.56 4.14 - 5.80 x10E6/uL   Hemoglobin 14.2 12.6 - 17.7 g/dL   Hematocrit 96.042.4 45.437.5 - 51.0 %   MCV 93 79 - 97 fL   MCH 31.1 26.6 - 33.0 pg   MCHC 33.5 31.5 - 35.7 g/dL  RDW 13.6 12.3 - 15.4 %   Platelets 258 150 - 379 x10E3/uL   Neutrophils 47 %   Lymphs 36 %   Monocytes 14 %   Eos 2 %   Basos 1 %   Neutrophils Absolute 2.2 1.4 - 7.0 x10E3/uL   Lymphocytes Absolute 1.7 0.7 - 3.1 x10E3/uL   Monocytes Absolute 0.7 0.1 - 0.9 x10E3/uL   EOS (ABSOLUTE) 0.1 0.0 - 0.4 x10E3/uL   Basophils Absolute 0.0 0.0 - 0.2 x10E3/uL   Immature Granulocytes 0 %   Immature Grans (Abs) 0.0 0.0 - 0.1 x10E3/uL  Comprehensive metabolic panel  Result Value Ref Range   Glucose 110 (H) 65 - 99 mg/dL   BUN 14 6 - 24 mg/dL   Creatinine, Ser 9.60 0.76 - 1.27 mg/dL   GFR calc non Af Amer 87 >59 mL/min/1.73   GFR calc Af Amer 101 >59 mL/min/1.73   BUN/Creatinine Ratio 15 9 - 20   Sodium 144 134 - 144 mmol/L   Potassium 4.9 3.5 - 5.2 mmol/L   Chloride 103 96 - 106 mmol/L   CO2 26 18 - 29 mmol/L   Calcium 9.1 8.7 - 10.2 mg/dL   Total Protein 6.8 6.0 - 8.5 g/dL   Albumin 4.4 3.5 - 5.5 g/dL   Globulin, Total 2.4 1.5 - 4.5 g/dL   Albumin/Globulin Ratio 1.8 1.1 - 2.5   Bilirubin Total 0.5 0.0 - 1.2 mg/dL   Alkaline Phosphatase 45 39 - 117 IU/L   AST 17 0 - 40 IU/L   ALT 14 0 - 44 IU/L  Lipid Panel w/o Chol/HDL Ratio  Result Value Ref Range   Cholesterol, Total 198 100 - 199 mg/dL   Triglycerides 50 0 - 149 mg/dL   HDL  66 >45 mg/dL   VLDL Cholesterol Cal 10 5 - 40 mg/dL   LDL Calculated 409 (H) 0 - 99 mg/dL  TSH  Result Value Ref Range   TSH 1.180 0.450 - 4.500 uIU/mL  PSA  Result Value Ref Range   Prostate Specific Ag, Serum 0.8 0.0 - 4.0 ng/mL  UA/M w/rflx Culture, Routine  Result Value Ref Range   Specific Gravity, UA 1.025 1.005 - 1.030   pH, UA 6.0 5.0 - 7.5   Color, UA Yellow Yellow   Appearance Ur Clear Clear   Leukocytes, UA Negative Negative   Protein, UA Negative Negative/Trace   Glucose, UA Negative Negative   Ketones, UA Negative Negative   RBC, UA Negative Negative   Bilirubin, UA Negative Negative   Urobilinogen, Ur 0.2 0.2 - 1.0 mg/dL   Nitrite, UA Negative Negative  HIV antibody  Result Value Ref Range   HIV Screen 4th Generation wRfx Non Reactive Non Reactive  HM COLONOSCOPY  Result Value Ref Range   HM Colonoscopy Normal, repeat 10years       Assessment & Plan:   Problem List Items Addressed This Visit    None    Visit Diagnoses    Routine general medical examination at a health care facility    -  Primary   Up to date on vaccines. Screening labs checked today. Colonoscopy up to date. Continue diet and exercise.    Relevant Orders   CBC with Differential/Platelet   Comprehensive metabolic panel   Lipid Panel w/o Chol/HDL Ratio   TSH   UA/M w/rflx Culture, Routine   PSA   Screening for cholesterol level       Labs drawn today. Await results.    Relevant  Orders   Lipid Panel w/o Chol/HDL Ratio   Screening for prostate cancer       Labs drawn today. Await results.    Relevant Orders   PSA       Discussed aspirin prophylaxis for myocardial infarction prevention and decision was it was not indicated  LABORATORY TESTING:  Health maintenance labs ordered today as discussed above.   The natural history of prostate cancer and ongoing controversy regarding screening and potential treatment outcomes of prostate cancer has been discussed with the patient. The  meaning of a false positive PSA and a false negative PSA has been discussed. He indicates understanding of the limitations of this screening test and wishes to proceed with screening PSA testing.   IMMUNIZATIONS:   - Tdap: Tetanus vaccination status reviewed: last tetanus booster within 10 years. - Influenza: Given elsewhere - Pneumovax: Up to date  SCREENING: - Colonoscopy: Up to date  Discussed with patient purpose of the colonoscopy is to detect colon cancer at curable precancerous or early stages    PATIENT COUNSELING:    Sexuality: Discussed sexually transmitted diseases, partner selection, use of condoms, avoidance of unintended pregnancy  and contraceptive alternatives.   Advised to avoid cigarette smoking.  I discussed with the patient that most people either abstain from alcohol or drink within safe limits (<=14/week and <=4 drinks/occasion for males, <=7/weeks and <= 3 drinks/occasion for females) and that the risk for alcohol disorders and other health effects rises proportionally with the number of drinks per week and how often a drinker exceeds daily limits.  Discussed cessation/primary prevention of drug use and availability of treatment for abuse.   Diet: Encouraged to adjust caloric intake to maintain  or achieve ideal body weight, to reduce intake of dietary saturated fat and total fat, to limit sodium intake by avoiding high sodium foods and not adding table salt, and to maintain adequate dietary potassium and calcium preferably from fresh fruits, vegetables, and low-fat dairy products.    stressed the importance of regular exercise  Injury prevention: Discussed safety belts, safety helmets, smoke detector, smoking near bedding or upholstery.   Dental health: Discussed importance of regular tooth brushing, flossing, and dental visits.   Follow up plan: NEXT PREVENTATIVE PHYSICAL DUE IN 1 YEAR. Return in about 1 year (around 01/06/2018) for Physical.

## 2017-01-07 LAB — COMPREHENSIVE METABOLIC PANEL
ALT: 18 IU/L (ref 0–44)
AST: 17 IU/L (ref 0–40)
Albumin/Globulin Ratio: 1.8 (ref 1.2–2.2)
Albumin: 4.2 g/dL (ref 3.5–5.5)
Alkaline Phosphatase: 42 IU/L (ref 39–117)
BUN/Creatinine Ratio: 12 (ref 9–20)
BUN: 12 mg/dL (ref 6–24)
Bilirubin Total: 0.6 mg/dL (ref 0.0–1.2)
CO2: 25 mmol/L (ref 18–29)
Calcium: 8.9 mg/dL (ref 8.7–10.2)
Chloride: 101 mmol/L (ref 96–106)
Creatinine, Ser: 1.03 mg/dL (ref 0.76–1.27)
GFR calc Af Amer: 92 mL/min/{1.73_m2} (ref >59)
GFR calc non Af Amer: 80 mL/min/{1.73_m2} (ref >59)
Globulin, Total: 2.4 g/dL (ref 1.5–4.5)
Glucose: 96 mg/dL (ref 65–99)
Potassium: 4.7 mmol/L (ref 3.5–5.2)
Sodium: 141 mmol/L (ref 134–144)
Total Protein: 6.6 g/dL (ref 6.0–8.5)

## 2017-01-07 LAB — CBC WITH DIFFERENTIAL/PLATELET
Basophils Absolute: 0 10*3/uL (ref 0.0–0.2)
Basos: 1 %
EOS (ABSOLUTE): 0.1 10*3/uL (ref 0.0–0.4)
Eos: 3 %
Hematocrit: 41.1 % (ref 37.5–51.0)
Hemoglobin: 13.8 g/dL (ref 13.0–17.7)
Immature Grans (Abs): 0 10*3/uL (ref 0.0–0.1)
Immature Granulocytes: 0 %
Lymphocytes Absolute: 1.7 10*3/uL (ref 0.7–3.1)
Lymphs: 39 %
MCH: 30.9 pg (ref 26.6–33.0)
MCHC: 33.6 g/dL (ref 31.5–35.7)
MCV: 92 fL (ref 79–97)
Monocytes Absolute: 0.5 10*3/uL (ref 0.1–0.9)
Monocytes: 12 %
Neutrophils Absolute: 2 10*3/uL (ref 1.4–7.0)
Neutrophils: 45 %
Platelets: 258 10*3/uL (ref 150–379)
RBC: 4.46 x10E6/uL (ref 4.14–5.80)
RDW: 13.5 % (ref 12.3–15.4)
WBC: 4.3 10*3/uL (ref 3.4–10.8)

## 2017-01-07 LAB — TSH: TSH: 1.5 u[IU]/mL (ref 0.450–4.500)

## 2017-01-07 LAB — PSA: Prostate Specific Ag, Serum: 0.4 ng/mL (ref 0.0–4.0)

## 2017-01-07 LAB — LIPID PANEL W/O CHOL/HDL RATIO
Cholesterol, Total: 193 mg/dL (ref 100–199)
HDL: 62 mg/dL (ref >39)
LDL Calculated: 118 mg/dL — ABNORMAL HIGH (ref 0–99)
Triglycerides: 64 mg/dL (ref 0–149)
VLDL Cholesterol Cal: 13 mg/dL (ref 5–40)

## 2017-05-13 ENCOUNTER — Encounter
Admission: EM | Disposition: A | Discharge status: Home / Self Care | Payer: Self-pay | Source: Ambulatory Visit | Attending: Urology

## 2017-05-13 ENCOUNTER — Encounter: Payer: Self-pay | Admitting: *Deleted

## 2017-05-13 ENCOUNTER — Ambulatory Visit
Admission: EM | Admit: 2017-05-13 | Discharge: 2017-05-13 | Disposition: A | Discharge status: Home / Self Care | Payer: 59 | Source: Ambulatory Visit | Attending: Urology | Admitting: Urology

## 2017-05-13 DIAGNOSIS — Z87891 Personal history of nicotine dependence: Secondary | ICD-10-CM | POA: Diagnosis not present

## 2017-05-13 DIAGNOSIS — N200 Calculus of kidney: Secondary | ICD-10-CM | POA: Diagnosis not present

## 2017-05-13 DIAGNOSIS — N23 Unspecified renal colic: Secondary | ICD-10-CM | POA: Diagnosis not present

## 2017-05-13 DIAGNOSIS — N201 Calculus of ureter: Secondary | ICD-10-CM | POA: Diagnosis not present

## 2017-05-13 HISTORY — PX: EXTRACORPOREAL SHOCK WAVE LITHOTRIPSY: SHX1557

## 2017-05-13 SURGERY — LITHOTRIPSY, ESWL
Anesthesia: Moderate Sedation | Laterality: Left

## 2017-05-13 MED ORDER — NUCYNTA 50 MG PO TABS: 50.0000 mg | ORAL_TABLET | Freq: Four times a day (QID) | ORAL | 0 refills | Status: DC | PRN

## 2017-05-13 MED ORDER — MORPHINE SULFATE (PF) 10 MG/ML IV SOLN
INTRAVENOUS | Status: AC
Start: 2017-05-13 — End: 2017-05-13
  Administered 2017-05-13: 10 mg via INTRAMUSCULAR
  Filled 2017-05-13: qty 1

## 2017-05-13 MED ORDER — PROMETHAZINE HCL 25 MG/ML IJ SOLN
INTRAMUSCULAR | Status: AC
  Administered 2017-05-13: 25 mg via INTRAMUSCULAR
  Filled 2017-05-13: qty 1

## 2017-05-13 MED ORDER — FUROSEMIDE 10 MG/ML IJ SOLN
INTRAMUSCULAR | Status: AC
  Administered 2017-05-13: 10 mg via INTRAVENOUS
  Filled 2017-05-13: qty 2

## 2017-05-13 MED ORDER — MIDAZOLAM HCL 2 MG/2ML IJ SOLN
INTRAMUSCULAR | Status: AC
  Administered 2017-05-13: 1 mg via INTRAMUSCULAR
  Filled 2017-05-13: qty 2

## 2017-05-13 MED ORDER — DEXTROSE-NACL 5-0.45 % IV SOLN
INTRAVENOUS | Status: DC
  Administered 2017-05-13: 12:00:00 via INTRAVENOUS

## 2017-05-13 MED ORDER — FUROSEMIDE 10 MG/ML IJ SOLN
10.0000 mg | Freq: Once | INTRAMUSCULAR | Status: AC
  Administered 2017-05-13: 10 mg via INTRAVENOUS

## 2017-05-13 MED ORDER — LEVOFLOXACIN 500 MG PO TABS
500.0000 mg | ORAL_TABLET | Freq: Once | ORAL | Status: AC
  Administered 2017-05-13: 500 mg via ORAL

## 2017-05-13 MED ORDER — DIPHENHYDRAMINE HCL 25 MG PO CAPS
ORAL_CAPSULE | ORAL | Status: AC
  Administered 2017-05-13: 25 mg via ORAL
  Filled 2017-05-13: qty 1

## 2017-05-13 MED ORDER — PROMETHAZINE HCL 25 MG/ML IJ SOLN
25.0000 mg | Freq: Once | INTRAMUSCULAR | Status: AC
  Administered 2017-05-13: 25 mg via INTRAMUSCULAR

## 2017-05-13 MED ORDER — CIPROFLOXACIN HCL 500 MG PO TABS: 500.0000 mg | ORAL_TABLET | Freq: Two times a day (BID) | ORAL | 0 refills | Status: DC

## 2017-05-13 MED ORDER — MORPHINE SULFATE (PF) 10 MG/ML IV SOLN
10.0000 mg | Freq: Once | INTRAVENOUS | Status: AC
  Administered 2017-05-13: 10 mg via INTRAMUSCULAR

## 2017-05-13 MED ORDER — FUROSEMIDE 10 MG/ML IJ SOLN
INTRAMUSCULAR | Status: AC
  Filled 2017-05-13: qty 2

## 2017-05-13 MED ORDER — ONDANSETRON 8 MG PO TBDP: 8.0000 mg | ORAL_TABLET | Freq: Four times a day (QID) | ORAL | 3 refills | Status: DC | PRN

## 2017-05-13 MED ORDER — LEVOFLOXACIN 500 MG PO TABS
ORAL_TABLET | ORAL | Status: AC
  Administered 2017-05-13: 500 mg via ORAL
  Filled 2017-05-13: qty 1

## 2017-05-13 MED ORDER — TAMSULOSIN HCL 0.4 MG PO CAPS: 0.4000 mg | ORAL_CAPSULE | Freq: Every day | ORAL | 0 refills | Status: DC

## 2017-05-13 MED ORDER — DIPHENHYDRAMINE HCL 25 MG PO CAPS
25.0000 mg | ORAL_CAPSULE | ORAL | Status: AC
  Administered 2017-05-13: 25 mg via ORAL

## 2017-05-13 MED ORDER — MIDAZOLAM HCL 2 MG/2ML IJ SOLN
1.0000 mg | Freq: Once | INTRAMUSCULAR | Status: AC
  Administered 2017-05-13: 1 mg via INTRAMUSCULAR

## 2017-05-13 NOTE — Discharge Instructions (Signed)
Kidney Stones Kidney stones (urolithiasis) are rock-like masses that form inside of the kidneys. Kidneys are organs that make pee (urine). A kidney stone can cause very bad pain and can block the flow of pee. The stone usually leaves your body (passes) through your pee. You may need to have a doctor take out the stone. Follow these instructions at home: Eating and drinking  Drink enough fluid to keep your pee clear or pale yellow. This will help you pass the stone.  If told by your doctor, change the foods you eat (your diet). This may include: ? Limiting how much salt (sodium) you eat. ? Eating more fruits and vegetables. ? Limiting how much meat, poultry, fish, and eggs you eat.  Follow instructions from your doctor about eating or drinking restrictions. General instructions  Collect pee samples as told by your doctor. You may need to collect a pee sample: ? 24 hours after a stone comes out. ? 8-12 weeks after a stone comes out, and every 6-12 months after that.  Strain your pee every time you pee (urinate), for as long as told. Use the strainer that your doctor recommends.  Do not throw out the stone. Keep it so that it can be tested by your doctor.  Take over-the-counter and prescription medicines only as told by your doctor.  Keep all follow-up visits as told by your doctor. This is important. You may need follow-up tests. Preventing kidney stones To prevent another kidney stone:  Drink enough fluid to keep your pee clear or pale yellow. This is the best way to prevent kidney stones.  Eat healthy foods.  Avoid certain foods as told by your doctor. You may be told to eat less protein.  Stay at a healthy weight.  Contact a doctor if:  You have pain that gets worse or does not get better with medicine. Get help right away if:  You have a fever or chills.  You get very bad pain.  You get new pain in your belly (abdomen).  You pass out (faint).  You cannot pee. This  information is not intended to replace advice given to you by your health care provider. Make sure you discuss any questions you have with your health care provider. Document Released: 04/27/2008 Document Revised: 07/28/2016 Document Reviewed: 07/28/2016 Elsevier Interactive Patient Education  2017 [place]. Dietary Guidelines to Help Prevent Kidney Stones Kidney stones are deposits of minerals and salts that form inside your kidneys. Your risk of developing kidney stones may be greater depending on your diet, your lifestyle, the medicines you take, and whether you have certain medical conditions. Most people can reduce their chances of developing kidney stones by following the instructions below. Depending on your overall health and the type of kidney stones you tend to develop, your dietitian may give you more specific instructions. What are tips for following this plan? Reading food labels  Choose foods with "no salt added" or "low-salt" labels. Limit your sodium intake to less than 1500 mg per day.  Choose foods with calcium for each meal and snack. Try to eat about 300 mg of calcium at each meal. Foods that contain 200-500 mg of calcium per serving include: ? 8 oz (237 ml) of milk, fortified nondairy milk, and fortified fruit juice. ? 8 oz (237 ml) of kefir, yogurt, and soy yogurt. ? 4 oz (118 ml) of tofu. ? 1 oz of cheese. ? 1 cup (300 g) of dried figs. ? 1 cup (91 g)  of cooked broccoli. ? 1-3 oz can of sardines or mackerel.  Most people need 1000 to 1500 mg of calcium each day. Talk to your dietitian about how much calcium is recommended for you. Shopping  Buy plenty of fresh fruits and vegetables. Most people do not need to avoid fruits and vegetables, even if they contain nutrients that may contribute to kidney stones.  When shopping for convenience foods, choose: ? Whole pieces of fruit. ? Premade salads with dressing on the side. ? Low-fat fruit and yogurt  smoothies.  Avoid buying frozen meals or prepared deli foods.  Look for foods with live cultures, such as yogurt and kefir. Cooking  Do not add salt to food when cooking. Place a salt shaker on the table and allow each person to add his or her own salt to taste.  Use vegetable protein, such as beans, textured vegetable protein (TVP), or tofu instead of meat in pasta, casseroles, and soups. Meal planning  Eat less salt, if told by your dietitian. To do this: ? Avoid eating processed or premade food. ? Avoid eating fast food.  Eat less animal protein, including cheese, meat, poultry, or fish, if told by your dietitian. To do this: ? Limit the number of times you have meat, poultry, fish, or cheese each week. Eat a diet free of meat at least 2 days a week. ? Eat only one serving each day of meat, poultry, fish, or seafood. ? When you prepare animal protein, cut pieces into small portion sizes. For most meat and fish, one serving is about the size of one deck of cards.  Eat at least 5 servings of fresh fruits and vegetables each day. To do this: ? Keep fruits and vegetables on hand for snacks. ? Eat 1 piece of fruit or a handful of berries with breakfast. ? Have a salad and fruit at lunch. ? Have two kinds of vegetables at dinner.  Limit foods that are high in a substance called oxalate. These include: ? Spinach. ? Rhubarb. ? Beets. ? Potato chips and french fries. ? Nuts.  If you regularly take a diuretic medicine, make sure to eat at least 1-2 fruits or vegetables high in potassium each day. These include: ? Avocado. ? Banana. ? Orange, prune, carrot, or tomato juice. ? Baked potato. ? Cabbage. ? Beans and split peas. General instructions  Drink enough fluid to keep your urine clear or pale yellow. This is the most important thing you can do.  Talk to your health care provider and dietitian about taking daily supplements. Depending on your health and the cause of your  kidney stones, you may be advised: ? Not to take supplements with vitamin C. ? To take a calcium supplement. ? To take a daily probiotic supplement. ? To take other supplements such as magnesium, fish oil, or vitamin B6.  Take all medicines and supplements as told by your health care provider.  Limit alcohol intake to no more than 1 drink a day for nonpregnant women and 2 drinks a day for men. One drink equals 12 oz of beer, 5 oz of wine, or 1 oz of hard liquor.  Lose weight if told by your health care provider. Work with your dietitian to find strategies and an eating plan that works best for you. What foods are not recommended? Limit your intake of the following foods, or as told by your dietitian. Talk to your dietitian about specific foods you should avoid based on the type  of kidney stones and your overall health. Grains Breads. Bagels. Rolls. Baked goods. Salted crackers. Cereal. Pasta. Vegetables Spinach. Rhubarb. Beets. Canned vegetables. Rosita FirePickles. Olives. Meats and other protein foods Nuts. Nut butters. Large portions of meat, poultry, or fish. Salted or cured meats. Deli meats. Hot dogs. Sausages. Dairy Cheese. Beverages Regular soft drinks. Regular vegetable juice. Seasonings and other foods Seasoning blends with salt. Salad dressings. Canned soups. Soy sauce. Ketchup. Barbecue sauce. Canned pasta sauce. Casseroles. Pizza. Lasagna. Frozen meals. Potato chips. [place] fries. Summary  You can reduce your risk of kidney stones by making changes to your diet.  The most important thing you can do is drink enough fluid. You should drink enough fluid to keep your urine clear or pale yellow.  Ask your health care provider or dietitian how much protein from animal sources you should eat each day, and also how much salt and calcium you should have each day. This information is not intended to replace advice given to you by your health care provider. Make sure you discuss any questions  you have with your health care provider. Document Released: 03/06/2011 Document Revised: 10/20/2016 Document Reviewed: 10/20/2016 Elsevier Interactive Patient Education  2017 [employer] Kidney Stones Kidney stones (urolithiasis) are rock-like masses that form inside of the kidneys. Kidneys are organs that make pee (urine). A kidney stone can cause very bad pain and can block the flow of pee. The stone usually leaves your body (passes) through your pee. You may need to have a doctor take out the stone. Follow these instructions at home: Eating and drinking  Drink enough fluid to keep your pee clear or pale yellow. This will help you pass the stone.  If told by your doctor, change the foods you eat (your diet). This may include: ? Limiting how much salt (sodium) you eat. ? Eating more fruits and vegetables. ? Limiting how much meat, poultry, fish, and eggs you eat.  Follow instructions from your doctor about eating or drinking restrictions. General instructions  Collect pee samples as told by your doctor. You may need to collect a pee sample: ? 24 hours after a stone comes out. ? 8-12 weeks after a stone comes out, and every 6-12 months after that.  Strain your pee every time you pee (urinate), for as long as told. Use the strainer that your doctor recommends.  Do not throw out the stone. Keep it so that it can be tested by your doctor.  Take over-the-counter and prescription medicines only as told by your doctor.  Keep all follow-up visits as told by your doctor. This is important. You may need follow-up tests. Preventing kidney stones To prevent another kidney stone:  Drink enough fluid to keep your pee clear or pale yellow. This is the best way to prevent kidney stones.  Eat healthy foods.  Avoid certain foods as told by your doctor. You may be told to eat less protein.  Stay at a healthy weight.  Contact a doctor if:  You have pain that gets worse or does not get  better with medicine. Get help right away if:  You have a fever or chills.  You get very bad pain.  You get new pain in your belly (abdomen).  You pass out (faint).  You cannot pee. This information is not intended to replace advice given to you by your health care provider. Make sure you discuss any questions you have with your health care provider. Document Released: 04/27/2008 Document Revised: 07/28/2016 Document  Reviewed: 07/28/2016 Elsevier Interactive Patient Education  2017 [employer].

## 2017-05-13 NOTE — Progress Notes (Signed)
Pt voided 250cc bloody urine   Strained without stones or sediment noted

## 2017-05-13 NOTE — Progress Notes (Signed)
Received from lithotripsy unit per wheelchair

## 2017-05-13 NOTE — Progress Notes (Signed)
Pt voided 300cc more blood tinged urine   Strained without stone or sediment

## 2017-05-13 NOTE — Progress Notes (Signed)
Reddened area on left flank noted

## 2017-05-14 ENCOUNTER — Encounter: Payer: Self-pay | Admitting: Urology

## 2017-05-17 NOTE — H&P (Signed)
NAME:  Thomas Whitney, Thomas Whitney               ACCOUNT NO.:  [id number]  MEDICAL RECORD NO.:  [id number]  LOCATION:                                 FACILITY:  PHYSICIAN:  Anola GurneyMichael Wolff          DATE OF BIRTH:  October 02, 1961  DATE OF ADMISSION:  05/18/2017 DATE OF DISCHARGE:                            HISTORY AND PHYSICAL   SAME-DAY SURGERY:  May 18, 2017.  CHIEF COMPLAINT:  Flank pain.  HISTORY OF PRESENT ILLNESS:  Mr. Thomas Whitney is a 56 year old white male with a 5 x 5 mm left UPJ stone who underwent lithotripsy on June 26th.  He has failed to pass stone fragments and has residual stone fragments present in the left upper ureter.  He continues to have flank pain and nausea.  He comes in for cystoscopy with stent placement.  ALLERGIES:  NO DRUG ALLERGIES.  CURRENT MEDICATIONS:  Levaquin, Nucynta, and Zuplenz.  SURGICAL HISTORY: 1. Tonsillectomy in 1966. 2. Lithotripsy 2000. 3. Lithotripsy 2005. 4. Right inguinal herniorrhaphy 2013. 5. Left inguinal herniorrhaphy 2017. 6. Vasectomy 1992.  PAST AND CURRENT MEDICAL CONDITIONS:  Negative except for recurrent kidney stone disease.  REVIEW OF SYSTEMS:  The patient denies chest pain, shortness of breath, diabetes, stroke, hypertension, or heart disease.  SOCIAL HISTORY:  The patient quit smoking in 2017 with a 30-pack-year history.  He consumes 2 alcoholic beverages per week.  FAMILY HISTORY:  Negative for urological disease.  PHYSICAL EXAMINATION:  GENERAL:  Well-nourished white male in no acute distress.  HEENT:  Sclerae were clear.  NECK:  Supple.  No palpable cervical adenopathy.  LUNGS:  Clear to auscultation.  CARDIOVASCULAR: Regular rhythm and rate without audible murmurs.  ABDOMEN:  Soft, nontender abdomen.  GU:  Deferred.  RECTAL:  Deferred.  NEUROMUSCULAR: Alert and oriented x3.  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  PLAN:  Cystoscopy with stent  placement.    ______________________________ Anola GurneyMichael Wolff   ______________________________ Anola GurneyMichael Wolff    MW/MEDQ  D:  05/17/2017  T:  05/17/2017  Job:  [id number]

## 2017-05-18 ENCOUNTER — Ambulatory Visit: Payer: 59 | Admitting: Anesthesiology

## 2017-05-18 ENCOUNTER — Ambulatory Visit
Admission: RE | Admit: 2017-05-18 | Discharge: 2017-05-18 | Disposition: A | Discharge status: Home / Self Care | Payer: 59 | Source: Ambulatory Visit | Attending: Urology | Admitting: Urology

## 2017-05-18 ENCOUNTER — Encounter: Payer: Self-pay | Admitting: *Deleted

## 2017-05-18 ENCOUNTER — Encounter
Admission: RE | Disposition: A | Discharge status: Home / Self Care | Payer: Self-pay | Source: Ambulatory Visit | Attending: Urology

## 2017-05-18 DIAGNOSIS — N200 Calculus of kidney: Secondary | ICD-10-CM | POA: Diagnosis not present

## 2017-05-18 DIAGNOSIS — Z87891 Personal history of nicotine dependence: Secondary | ICD-10-CM | POA: Diagnosis not present

## 2017-05-18 DIAGNOSIS — Z87442 Personal history of urinary calculi: Secondary | ICD-10-CM | POA: Insufficient documentation

## 2017-05-18 DIAGNOSIS — K219 Gastro-esophageal reflux disease without esophagitis: Secondary | ICD-10-CM | POA: Insufficient documentation

## 2017-05-18 DIAGNOSIS — N201 Calculus of ureter: Secondary | ICD-10-CM

## 2017-05-18 DIAGNOSIS — Z79899 Other long term (current) drug therapy: Secondary | ICD-10-CM | POA: Diagnosis not present

## 2017-05-18 HISTORY — PX: CYSTOSCOPY WITH STENT PLACEMENT: SHX5790

## 2017-05-18 SURGERY — CYSTOSCOPY, WITH STENT INSERTION
Anesthesia: General | Site: Ureter | Laterality: Left | Wound class: Clean Contaminated

## 2017-05-18 MED ORDER — CEFAZOLIN SODIUM-DEXTROSE 1-4 GM/50ML-% IV SOLN
1.0000 g | Freq: Once | INTRAVENOUS | Status: AC
  Administered 2017-05-18: 1 g via INTRAVENOUS

## 2017-05-18 MED ORDER — MIDAZOLAM HCL 2 MG/2ML IJ SOLN
INTRAMUSCULAR | Status: DC | PRN
  Administered 2017-05-18: 2 mg via INTRAVENOUS

## 2017-05-18 MED ORDER — IOTHALAMATE MEGLUMINE 43 % IV SOLN
INTRAVENOUS | Status: DC | PRN
  Administered 2017-05-18: 20 mL via URETHRAL

## 2017-05-18 MED ORDER — FAMOTIDINE 20 MG PO TABS
ORAL_TABLET | ORAL | Status: AC
  Filled 2017-05-18: qty 1

## 2017-05-18 MED ORDER — LIDOCAINE HCL 2 % EX GEL
CUTANEOUS | Status: DC | PRN
  Administered 2017-05-18: 1

## 2017-05-18 MED ORDER — CEFAZOLIN SODIUM-DEXTROSE 1-4 GM/50ML-% IV SOLN
INTRAVENOUS | Status: AC
  Filled 2017-05-18: qty 50

## 2017-05-18 MED ORDER — FENTANYL CITRATE (PF) 100 MCG/2ML IJ SOLN
INTRAMUSCULAR | Status: AC
  Filled 2017-05-18: qty 2

## 2017-05-18 MED ORDER — PROPOFOL 10 MG/ML IV BOLUS
INTRAVENOUS | Status: AC
  Filled 2017-05-18: qty 20

## 2017-05-18 MED ORDER — KETOROLAC TROMETHAMINE 30 MG/ML IJ SOLN
INTRAMUSCULAR | Status: DC | PRN
  Administered 2017-05-18: 30 mg via INTRAVENOUS

## 2017-05-18 MED ORDER — PROPOFOL 10 MG/ML IV BOLUS
INTRAVENOUS | Status: DC | PRN
  Administered 2017-05-18: 75 mg via INTRAVENOUS
  Administered 2017-05-18: 25 mg via INTRAVENOUS
  Administered 2017-05-18: 150 mg via INTRAVENOUS

## 2017-05-18 MED ORDER — BELLADONNA ALKALOIDS-OPIUM 16.2-60 MG RE SUPP
RECTAL | Status: AC
  Filled 2017-05-18: qty 1

## 2017-05-18 MED ORDER — FAMOTIDINE 20 MG PO TABS
20.0000 mg | ORAL_TABLET | Freq: Once | ORAL | Status: AC
  Administered 2017-05-18: 20 mg via ORAL

## 2017-05-18 MED ORDER — ONDANSETRON 8 MG PO TBDP: 8.0000 mg | ORAL_TABLET | Freq: Four times a day (QID) | ORAL | 3 refills | Status: DC | PRN

## 2017-05-18 MED ORDER — ONDANSETRON HCL 4 MG/2ML IJ SOLN: 4.0000 mg | Freq: Once | INTRAMUSCULAR | Status: DC | PRN

## 2017-05-18 MED ORDER — NUCYNTA 50 MG PO TABS: 50.0000 mg | ORAL_TABLET | Freq: Four times a day (QID) | ORAL | 0 refills | Status: DC | PRN

## 2017-05-18 MED ORDER — LIDOCAINE HCL 2 % EX GEL
CUTANEOUS | Status: AC
  Filled 2017-05-18: qty 10

## 2017-05-18 MED ORDER — BELLADONNA ALKALOIDS-OPIUM 16.2-60 MG RE SUPP
RECTAL | Status: DC | PRN
  Administered 2017-05-18: 1 via RECTAL

## 2017-05-18 MED ORDER — DOCUSATE SODIUM 100 MG PO CAPS: 200.0000 mg | ORAL_CAPSULE | Freq: Two times a day (BID) | ORAL | 3 refills | Status: DC

## 2017-05-18 MED ORDER — MIDAZOLAM HCL 2 MG/2ML IJ SOLN
INTRAMUSCULAR | Status: AC
Start: 2017-05-18 — End: ?
  Filled 2017-05-18: qty 2

## 2017-05-18 MED ORDER — FENTANYL CITRATE (PF) 100 MCG/2ML IJ SOLN: 25.0000 ug | INTRAMUSCULAR | Status: DC | PRN

## 2017-05-18 MED ORDER — CIPROFLOXACIN HCL 500 MG PO TABS: 500.0000 mg | ORAL_TABLET | Freq: Two times a day (BID) | ORAL | 0 refills | Status: DC

## 2017-05-18 MED ORDER — ONDANSETRON HCL 4 MG/2ML IJ SOLN
INTRAMUSCULAR | Status: DC | PRN
  Administered 2017-05-18: 4 mg via INTRAVENOUS

## 2017-05-18 MED ORDER — LACTATED RINGERS IV SOLN
INTRAVENOUS | Status: DC
  Administered 2017-05-18: 14:00:00 via INTRAVENOUS

## 2017-05-18 SURGICAL SUPPLY — 20 items
BAG DRAIN CYSTO-URO LG1000N (MISCELLANEOUS) ×2 IMPLANT
CATH URETL 5X70 OPEN END (CATHETERS) ×2 IMPLANT
CONRAY 43 FOR UROLOGY 50M (MISCELLANEOUS) ×2 IMPLANT
GLOVE BIO SURGEON STRL SZ7 (GLOVE) ×4 IMPLANT
GLOVE BIO SURGEON STRL SZ7.5 (GLOVE) ×2 IMPLANT
GOWN STRL REUS W/ TWL LRG LVL4 (GOWN DISPOSABLE) ×1 IMPLANT
GOWN STRL REUS W/ TWL XL LVL3 (GOWN DISPOSABLE) ×1 IMPLANT
GOWN STRL REUS W/TWL LRG LVL4 (GOWN DISPOSABLE) ×1
GOWN STRL REUS W/TWL XL LVL3 (GOWN DISPOSABLE) ×1
GUIDEWIRE STR ZIPWIRE 035X150 (MISCELLANEOUS) ×2 IMPLANT
KIT RM TURNOVER CYSTO AR (KITS) ×2 IMPLANT
PACK CYSTO AR (MISCELLANEOUS) ×2 IMPLANT
SET CYSTO W/LG BORE CLAMP LF (SET/KITS/TRAYS/PACK) ×2 IMPLANT
SOL .9 NS 3000ML IRR  AL (IV SOLUTION) ×1
SOL .9 NS 3000ML IRR UROMATIC (IV SOLUTION) ×1 IMPLANT
SOL PREP PVP 2OZ (MISCELLANEOUS) ×2
SOLUTION PREP PVP 2OZ (MISCELLANEOUS) ×1 IMPLANT
STENT URET 6FRX24 CONTOUR (STENTS) ×2 IMPLANT
STENT URET 6FRX26 CONTOUR (STENTS) IMPLANT
WATER STERILE IRR 1000ML POUR (IV SOLUTION) ×2 IMPLANT

## 2017-05-18 NOTE — Transfer of Care (Signed)
Immediate Anesthesia Transfer of Care Note  Patient: Thomas Whitney  Procedure(s) Performed: Procedure(s): CYSTOSCOPY WITH STENT PLACEMENT (Left)  Patient Location: PACU  Anesthesia Type:General  Level of Consciousness: sedated and responds to stimulation  Airway & Oxygen Therapy: Patient Spontanous Breathing and Patient connected to face mask oxygen  Post-op Assessment: Report given to RN and Post -op Vital signs reviewed and stable  Post vital signs: Reviewed and stable  Last Vitals:  Vitals:   05/18/17 1329 05/18/17 1623  BP: 130/79 113/73  Pulse: 71 66  Resp: 16 10  Temp: 36.9 C 36.5 C    Last Pain:  Vitals:   05/18/17 1329  TempSrc: Oral         Complications: No apparent anesthesia complications

## 2017-05-18 NOTE — H&P (Signed)
Date of Initial H&P: 05/17/17  History reviewed, patient examined, no change in status, stable for surgery.

## 2017-05-18 NOTE — Anesthesia Procedure Notes (Signed)
Procedure Name: LMA Insertion Date/Time: 05/18/2017 3:54 PM Performed by: Omer JackWEATHERLY, JANICE Pre-anesthesia Checklist: Patient identified, Patient being monitored, Timeout performed, Emergency Drugs available and Suction available Patient Re-evaluated:Patient Re-evaluated prior to inductionOxygen Delivery Method: Circle system utilized Preoxygenation: Pre-oxygenation with 100% oxygen Intubation Type: IV induction Ventilation: Mask ventilation without difficulty LMA: LMA inserted LMA Size: 4.0 Tube type: Oral Number of attempts: 1 Placement Confirmation: positive ETCO2 and breath sounds checked- equal and bilateral Tube secured with: Tape Dental Injury: Teeth and Oropharynx as per pre-operative assessment

## 2017-05-18 NOTE — Anesthesia Post-op Follow-up Note (Cosign Needed)
Anesthesia QCDR form completed.        

## 2017-05-18 NOTE — Anesthesia Preprocedure Evaluation (Signed)
Anesthesia Evaluation  Patient identified by MRN, date of birth, ID band Patient awake    Reviewed: Allergy & Precautions, H&P , NPO status , Patient's Chart, lab work & pertinent test results, reviewed documented beta blocker date and time   Airway Mallampati: II  TM Distance: >3 FB Neck ROM: full    Dental  (+) Teeth Intact   Pulmonary neg pulmonary ROS, former smoker,    Pulmonary exam normal        Cardiovascular Exercise Tolerance: Good negative cardio ROS Normal cardiovascular exam Rate:Normal     Neuro/Psych  Headaches, negative neurological ROS  negative psych ROS   GI/Hepatic negative GI ROS, Neg liver ROS, GERD  Medicated,  Endo/Other  negative endocrine ROS  Renal/GU negative Renal ROS  negative genitourinary   Musculoskeletal   Abdominal   Peds  Hematology negative hematology ROS (+)   Anesthesia Other Findings   Reproductive/Obstetrics negative OB ROS                             Anesthesia Physical Anesthesia Plan  ASA: II  Anesthesia Plan: General LMA   Post-op Pain Management:    Induction:   PONV Risk Score and Plan: 2 and Propofol, Midazolam and Ondansetron  Airway Management Planned:   Additional Equipment:   Intra-op Plan:   Post-operative Plan:   Informed Consent: I have reviewed the patients History and Physical, chart, labs and discussed the procedure including the risks, benefits and alternatives for the proposed anesthesia with the patient or authorized representative who has indicated his/her understanding and acceptance.     Plan Discussed with: CRNA  Anesthesia Plan Comments:         Anesthesia Quick Evaluation

## 2017-05-18 NOTE — Op Note (Signed)
Preoperative diagnosis: 1. Left ureterolithiasis                                            2. Renal colic  Postoperative diagnosis: Same   Procedure: 1. Left double pigtail ureteral stent placement                      2. Fluoroscopy with retrograde pyelogram    Surgeon: Suszanne ConnersMichael R. Evelene CroonWolff MD  Anesthesia: General  Indications:See the history and physical. After informed consent the above procedure(s) were requested     Technique and findings: After adequate general anesthesia had been obtained the patient was placed into dorsal lithotomy position and the perineum was prepped and draped in the usual fashion. The fluoroscopy revealed a 5 mm stone in the regional left upper ureter. The cystoscope was then coupled to the camera and visually advanced into the bladder. There were several small stone fragments present in the bladder. A bladder tumors were identified. At this point an 8 [place] open-ended ureteral catheter was introduced into the left ureteral orifice and retrograde pyelogram was performed. The pyelogram confirmed presence of a proximal stone but contrast was seen entering the renal collecting structures. No hydronephrosis was evident. At this point a 0.035 Glidewire was advanced up the left ureter under fluoroscopic guidance. A 6 x 24 cm double pigtail stent was advanced over the guidewire and positioned in the ureter. The guidewire was removed taking care leaving the stent in position. The retrieval suture was left attached to the stent. Bladder was drained and 10 cc of viscous Xylocaine was instilled within the urethra. A B&O suppository was placed. The procedure was then terminated and patient transferred to the recovery room in stable condition.

## 2017-05-18 NOTE — Discharge Instructions (Addendum)
Kidney Stones °Kidney stones (urolithiasis) are rock-like masses that form inside of the kidneys. Kidneys are organs that make pee (urine). A kidney stone can cause very bad pain and can block the flow of pee. The stone usually leaves your body (passes) through your pee. You may need to have a doctor take out the stone. °Follow these instructions at home: °Eating and drinking °· Drink enough fluid to keep your pee clear or pale yellow. This will help you pass the stone. °· If told by your doctor, change the foods you eat (your diet). This may include: °? Limiting how much salt (sodium) you eat. °? Eating more fruits and vegetables. °? Limiting how much meat, poultry, fish, and eggs you eat. °· Follow instructions from your doctor about eating or drinking restrictions. °General instructions °· Collect pee samples as told by your doctor. You may need to collect a pee sample: °? 24 hours after a stone comes out. °? 8-12 weeks after a stone comes out, and every 6-12 months after that. °· Strain your pee every time you pee (urinate), for as long as told. Use the strainer that your doctor recommends. °· Do not throw out the stone. Keep it so that it can be tested by your doctor. °· Take over-the-counter and prescription medicines only as told by your doctor. °· Keep all follow-up visits as told by your doctor. This is important. You may need follow-up tests. °Preventing kidney stones °To prevent another kidney stone: °· Drink enough fluid to keep your pee clear or pale yellow. This is the best way to prevent kidney stones. °· Eat healthy foods. °· Avoid certain foods as told by your doctor. You may be told to eat less protein. °· Stay at a healthy weight. ° °Contact a doctor if: °· You have pain that gets worse or does not get better with medicine. °Get help right away if: °· You have a fever or chills. °· You get very bad pain. °· You get new pain in your belly (abdomen). °· You pass out (faint). °· You cannot pee. °This  information is not intended to replace advice given to you by your health care provider. Make sure you discuss any questions you have with your health care provider. °Document Released: 04/27/2008 Document Revised: 07/28/2016 Document Reviewed: 07/28/2016 °Elsevier Interactive Patient Education © 2017 [employer] °Ureteral Stent Implantation, Care After °Refer to this sheet in the next few weeks. These instructions provide you with information about caring for yourself after your procedure. Your health care provider may also give you more specific instructions. Your treatment has been planned according to current medical practices, but problems sometimes occur. Call your health care provider if you have any problems or questions after your procedure. °What can I expect after the procedure? °After the procedure, it is common to have: °· Nausea. °· Mild pain when you urinate. You may feel this pain in your lower back or lower abdomen. Pain should stop within a few minutes after you urinate. This may last for up to 1 week. °· A small amount of blood in your urine for several days. ° °Follow these instructions at home: ° °Medicines °· Take over-the-counter and prescription medicines only as told by your health care provider. °· If you were prescribed an antibiotic medicine, take it as told by your health care provider. Do not stop taking the antibiotic even if you start to feel better. °· Do not drive for 24 hours if you received a sedative. °·   Do not drive or operate heavy machinery while taking prescription pain medicines. °Activity °· Return to your normal activities as told by your health care provider. Ask your health care provider what activities are safe for you. °· Do not lift anything that is heavier than 10 lb (4.5 kg). Follow this limit for 1 week after your procedure, or for as long as told by your health care provider. °General instructions °· Watch for any blood in your urine. Call your health care  provider if the amount of blood in your urine increases. °· If you have a catheter: °? Follow instructions from your health care provider about taking care of your catheter and collection bag. °? Do not take baths, swim, or use a hot tub until your health care provider approves. °· Drink enough fluid to keep your urine clear or pale yellow. °· Keep all follow-up visits as told by your health care provider. This is important. °Contact a health care provider if: °· You have pain that gets worse or does not get better with medicine, especially pain when you urinate. °· You have difficulty urinating. °· You feel nauseous or you vomit repeatedly during a period of more than 2 days after the procedure. °Get help right away if: °· Your urine is dark red or has blood clots in it. °· You are leaking urine (have incontinence). °· The end of the stent comes out of your urethra. °· You cannot urinate. °· You have sudden, sharp, or severe pain in your abdomen or lower back. °· You have a fever. °This information is not intended to replace advice given to you by your health care provider. Make sure you discuss any questions you have with your health care provider. °Document Released: 07/12/2013 Document Revised: 04/16/2016 Document Reviewed: 05/24/2015 °Elsevier Interactive Patient Education © 2018 [employer] ° ° °AMBULATORY SURGERY  °DISCHARGE INSTRUCTIONS ° ° °1) The drugs that you were given will stay in your system until tomorrow so for the next 24 hours you should not: ° °A) Drive an automobile °B) Make any legal decisions °C) Drink any alcoholic beverage ° ° °2) You may resume regular meals tomorrow.  Today it is better to start with liquids and gradually work up to solid foods. ° °You may eat anything you prefer, but it is better to start with liquids, then soup and crackers, and gradually work up to solid foods. ° ° °3) Please notify your doctor immediately if you have any unusual bleeding, trouble breathing, redness  and pain at the surgery site, drainage, fever, or pain not relieved by medication. ° ° ° °4) Additional Instructions: ° ° ° °Please contact your physician with any problems or Same Day Surgery at [phone], Monday through Friday 6 am to 4 pm, or [facility] Main number at [phone]. °

## 2017-05-19 ENCOUNTER — Encounter: Payer: Self-pay | Admitting: Urology

## 2017-05-20 NOTE — Anesthesia Postprocedure Evaluation (Signed)
Anesthesia Post Note  Patient: Thomas Whitney  Procedure(s) Performed: Procedure(s) (LRB): CYSTOSCOPY WITH STENT PLACEMENT (Left)  Patient location during evaluation: PACU Anesthesia Type: General Level of consciousness: awake and alert Pain management: pain level controlled Vital Signs Assessment: post-procedure vital signs reviewed and stable Respiratory status: spontaneous breathing, nonlabored ventilation, respiratory function stable and patient connected to nasal cannula oxygen Cardiovascular status: blood pressure returned to baseline and stable Postop Assessment: no signs of nausea or vomiting Anesthetic complications: no     Last Vitals:  Vitals:   05/18/17 1703 05/18/17 1715  BP: (!) 142/77 132/77  Pulse: 73 74  Resp: 16 16  Temp: 36.6 C     Last Pain:  Vitals:   05/19/17 0817  TempSrc:   PainSc: 0-No pain                 Yevette EdwardsJames G Adams

## 2017-05-21 DIAGNOSIS — N201 Calculus of ureter: Secondary | ICD-10-CM | POA: Diagnosis not present

## 2017-05-25 DIAGNOSIS — N201 Calculus of ureter: Secondary | ICD-10-CM | POA: Diagnosis not present

## 2017-05-25 DIAGNOSIS — N23 Unspecified renal colic: Secondary | ICD-10-CM | POA: Diagnosis not present

## 2017-06-29 DIAGNOSIS — N201 Calculus of ureter: Secondary | ICD-10-CM | POA: Diagnosis not present

## 2017-12-19 DIAGNOSIS — J019 Acute sinusitis, unspecified: Secondary | ICD-10-CM | POA: Diagnosis not present

## 2017-12-27 DIAGNOSIS — M9901 Segmental and somatic dysfunction of cervical region: Secondary | ICD-10-CM | POA: Diagnosis not present

## 2017-12-27 DIAGNOSIS — M542 Cervicalgia: Secondary | ICD-10-CM | POA: Diagnosis not present

## 2017-12-27 DIAGNOSIS — M5413 Radiculopathy, cervicothoracic region: Secondary | ICD-10-CM | POA: Diagnosis not present

## 2018-01-11 ENCOUNTER — Encounter: Payer: Self-pay | Admitting: Family Medicine

## 2018-01-11 ENCOUNTER — Ambulatory Visit (INDEPENDENT_AMBULATORY_CARE_PROVIDER_SITE_OTHER): Payer: 59 | Admitting: Family Medicine

## 2018-01-11 VITALS — BP 115/80 | HR 64 | Temp 98.3°F | Ht 68.0 in | Wt 180.5 lb

## 2018-01-11 DIAGNOSIS — N529 Male erectile dysfunction, unspecified: Secondary | ICD-10-CM | POA: Diagnosis not present

## 2018-01-11 DIAGNOSIS — Z0000 Encounter for general adult medical examination without abnormal findings: Secondary | ICD-10-CM

## 2018-01-11 DIAGNOSIS — Z125 Encounter for screening for malignant neoplasm of prostate: Secondary | ICD-10-CM | POA: Diagnosis not present

## 2018-01-11 DIAGNOSIS — N200 Calculus of kidney: Secondary | ICD-10-CM | POA: Diagnosis not present

## 2018-01-11 LAB — UA/M W/RFLX CULTURE, ROUTINE
Bilirubin, UA: NEGATIVE
Glucose, UA: NEGATIVE
Ketones, UA: NEGATIVE
Leukocytes, UA: NEGATIVE
Nitrite, UA: NEGATIVE
Protein, UA: NEGATIVE
RBC, UA: NEGATIVE
Specific Gravity, UA: 1.025 (ref 1.005–1.030)
Urobilinogen, Ur: 0.2 mg/dL (ref 0.2–1.0)
pH, UA: 6 (ref 5.0–7.5)

## 2018-01-11 NOTE — Assessment & Plan Note (Signed)
Resolved. No issues today. Call with any concerns.

## 2018-01-11 NOTE — Progress Notes (Signed)
BP 115/80 (BP Location: Left Arm, Patient Position: Sitting, Cuff Size: Normal)   Pulse 64   Temp 98.3 F (36.8 C)   Ht 5\' 8"  (1.727 m)   Wt 180 lb 8 oz (81.9 kg)   SpO2 100%   BMI 27.44 kg/m    Subjective:    Patient ID: Thomas Whitney, male    DOB: 1961/01/04, 57 y.o.   MRN: [id number]  HPI: Thomas Whitney is a 57 y.o. male presenting on 01/11/2018 for comprehensive medical examination. Current medical complaints include:none  Interim Problems from his last visit: yes- kidney stone, lithotripsy  Depression Screen done today and results listed below:  [facility] 01/06/2017 01/06/2016  Decreased Interest 0 0  Down, Depressed, Hopeless 0 0  PHQ - 2 Score 0 0    Past Medical History:  Past Medical History:  Diagnosis Date  . GERD (gastroesophageal reflux disease)   . Headache     Surgical History:  Past Surgical History:  Procedure Laterality Date  . COLONOSCOPY  2013  . CYSTOSCOPY WITH STENT PLACEMENT Left 05/18/2017   Procedure: CYSTOSCOPY WITH STENT PLACEMENT;  Surgeon: Orson ApeWolff, Michael R, MD;  Location: [facility];  Service: Urology;  Laterality: Left;  . EXTRACORPOREAL SHOCK WAVE LITHOTRIPSY Left 05/13/2017   Procedure: EXTRACORPOREAL SHOCK WAVE LITHOTRIPSY (ESWL);  Surgeon: Orson ApeWolff, Michael R, MD;  Location: [facility];  Service: Urology;  Laterality: Left;  . HERNIA REPAIR Right 11/03/2010   Direct inguinal hernia, large Ultra Pro mesh.  . INGUINAL HERNIA REPAIR Left 02/10/2016   Procedure: HERNIA REPAIR INGUINAL ADULT;  Surgeon: Earline MayotteJeffrey W Byrnett, MD;  Location: [facility];  Service: General;  Laterality: Left;  . INGUINAL HERNIA REPAIR Left 02/10/2016  . KIDNEY STONE SURGERY     2001, 2006  . TONSILLECTOMY  1966  . VASECTOMY  1992    Medications:  Current Outpatient Medications on File Prior to Visit  Medication Sig  . aspirin-acetaminophen-caffeine (EXCEDRIN MIGRAINE) 250-250-65 MG tablet Take 2 tablets by mouth every 8 (eight) hours as needed for  headache.  . ibuprofen (ADVIL,MOTRIN) 200 MG tablet Take 400 mg by mouth every 8 (eight) hours as needed (for pain.).   Marland Kitchen. loratadine (CLARITIN) 10 MG tablet Take 10 mg by mouth daily as needed (for allergies.).   Marland Kitchen. omeprazole (PRILOSEC) 20 MG capsule Take 20 mg by mouth daily as needed (for acid reflux/indigestion.).   Marland Kitchen. Soft Lens Products (REWETTING DROPS) SOLN Place 1 drop into both eyes daily as needed (for contact lenses.).   No current facility-administered medications on file prior to visit.     Allergies:  No Known Allergies  Social History:  Social History   Socioeconomic History  . Marital status: Married    Spouse name: Not on file  . Number of children: Not on file  . Years of education: Not on file  . Highest education level: Not on file  Social Needs  . Financial resource strain: Not on file  . Food insecurity - worry: Not on file  . Food insecurity - inability: Not on file  . Transportation needs - medical: Not on file  . Transportation needs - non-medical: Not on file  Occupational History  . Not on file  Tobacco Use  . Smoking status: Former Smoker    Years: 20.00    Types: Cigarettes    Last attempt to quit: 01/07/2016    Years since quitting: 2.0  . Smokeless tobacco: Current User    Types: Snuff  Substance and Sexual Activity  . Alcohol use: Yes    Alcohol/week: 0.0 oz    Comment: on occasion  . Drug use: No  . Sexual activity: Yes  Other Topics Concern  . Not on file  Social History Narrative  . Not on file   Social History   Tobacco Use  Smoking Status Former Smoker  . Years: 20.00  . Types: Cigarettes  . Last attempt to quit: 01/07/2016  . Years since quitting: 2.0  Smokeless Tobacco Current User  . Types: Snuff   Social History   Substance and Sexual Activity  Alcohol Use Yes  . Alcohol/week: 0.0 oz   Comment: on occasion    Family History:  Family History  Problem Relation Age of Onset  . Dementia Mother   . Hypertension  Mother   . Diabetes Father   . Diabetes Maternal Grandfather   . Diabetes Paternal Grandfather     Past medical history, surgical history, medications, allergies, family history and social history reviewed with patient today and changes made to appropriate areas of the chart.   Review of Systems  Constitutional: Negative.   HENT: Negative.   Eyes: Negative.   Respiratory: Negative.   Cardiovascular: Negative.   Gastrointestinal: Negative.   Genitourinary: Negative.   Musculoskeletal: Negative.   Skin: Negative.   Neurological: Negative.   Endo/Heme/Allergies: Negative.   Psychiatric/Behavioral: Negative.     All other ROS negative except what is listed above and in the HPI.      Objective:    BP 115/80 (BP Location: Left Arm, Patient Position: Sitting, Cuff Size: Normal)   Pulse 64   Temp 98.3 F (36.8 C)   Ht 5\' 8"  (1.727 m)   Wt 180 lb 8 oz (81.9 kg)   SpO2 100%   BMI 27.44 kg/m   Wt Readings from Last 3 Encounters:  01/11/18 180 lb 8 oz (81.9 kg)  05/18/17 180 lb (81.6 kg)  05/13/17 180 lb (81.6 kg)    Physical Exam  Constitutional: He is oriented to person, place, and time. He appears well-developed and well-nourished. No distress.  HENT:  Head: Normocephalic and atraumatic.  Right Ear: Hearing, tympanic membrane, external ear and ear canal normal.  Left Ear: Hearing, tympanic membrane, external ear and ear canal normal.  Nose: Nose normal.  Mouth/Throat: Uvula is midline, oropharynx is clear and moist and mucous membranes are normal. No oropharyngeal exudate.  Eyes: Conjunctivae, EOM and lids are normal. Pupils are equal, round, and reactive to light. Right eye exhibits no discharge. Left eye exhibits no discharge. No scleral icterus.  Neck: Normal range of motion. Neck supple. No JVD present. No tracheal deviation present. No thyromegaly present.  Cardiovascular: Normal rate, regular rhythm, normal heart sounds and intact distal pulses. Exam reveals no gallop  and no friction rub.  No murmur heard. Pulmonary/Chest: Effort normal and breath sounds normal. No stridor. No respiratory distress. He has no wheezes. He has no rales. He exhibits no tenderness.  Abdominal: Soft. Bowel sounds are normal. He exhibits no distension and no mass. There is no tenderness. There is no rebound and no guarding. Hernia confirmed negative in the right inguinal area and confirmed negative in the left inguinal area.  Genitourinary: Testes normal and penis normal. Cremasteric reflex is present. Right testis shows no mass, no swelling and no tenderness. Right testis is descended. Cremasteric reflex is not absent on the right side. Left testis shows no mass, no swelling and no tenderness. Left testis is  descended. Cremasteric reflex is not absent on the left side. Circumcised. No phimosis, paraphimosis, hypospadias, penile erythema or penile tenderness. No discharge found.  Musculoskeletal: Normal range of motion. He exhibits no edema, tenderness or deformity.  Lymphadenopathy:    He has no cervical adenopathy.  Neurological: He is alert and oriented to person, place, and time. He has normal reflexes. He displays normal reflexes. No cranial nerve deficit. He exhibits normal muscle tone. Coordination normal.  Skin: Skin is warm, dry and intact. No rash noted. He is not diaphoretic. No erythema.  Psychiatric: He has a normal mood and affect. His speech is normal and behavior is normal. Judgment and thought content normal. Cognition and memory are normal.  Nursing note and vitals reviewed.   Results for orders placed or performed in visit on 01/06/17  Microscopic Examination  Result Value Ref Range   WBC, UA None seen 0 - 5 /hpf   RBC, UA 11-30 (A) 0 - 2 /hpf   Epithelial Cells (non renal) None seen 0 - 10 /hpf   Mucus, UA Present (A) Not Estab.   Bacteria, UA Few (A) None seen/Few  CBC with Differential/Platelet  Result Value Ref Range   WBC 4.3 3.4 - 10.8 x10E3/uL   RBC  4.46 4.14 - 5.80 x10E6/uL   Hemoglobin 13.8 13.0 - 17.7 g/dL   Hematocrit 40.9 81.1 - 51.0 %   MCV 92 79 - 97 fL   MCH 30.9 26.6 - 33.0 pg   MCHC 33.6 31.5 - 35.7 g/dL   RDW 91.4 78.2 - 95.6 %   Platelets 258 150 - 379 x10E3/uL   Neutrophils 45 Not Estab. %   Lymphs 39 Not Estab. %   Monocytes 12 Not Estab. %   Eos 3 Not Estab. %   Basos 1 Not Estab. %   Neutrophils Absolute 2.0 1.4 - 7.0 x10E3/uL   Lymphocytes Absolute 1.7 0.7 - 3.1 x10E3/uL   Monocytes Absolute 0.5 0.1 - 0.9 x10E3/uL   EOS (ABSOLUTE) 0.1 0.0 - 0.4 x10E3/uL   Basophils Absolute 0.0 0.0 - 0.2 x10E3/uL   Immature Granulocytes 0 Not Estab. %   Immature Grans (Abs) 0.0 0.0 - 0.1 x10E3/uL  Comprehensive metabolic panel  Result Value Ref Range   Glucose 96 65 - 99 mg/dL   BUN 12 6 - 24 mg/dL   Creatinine, Ser 2.13 0.76 - 1.27 mg/dL   GFR calc non Af Amer 80 >59 mL/min/1.73   GFR calc Af Amer 92 >59 mL/min/1.73   BUN/Creatinine Ratio 12 9 - 20   Sodium 141 134 - 144 mmol/L   Potassium 4.7 3.5 - 5.2 mmol/L   Chloride 101 96 - 106 mmol/L   CO2 25 18 - 29 mmol/L   Calcium 8.9 8.7 - 10.2 mg/dL   Total Protein 6.6 6.0 - 8.5 g/dL   Albumin 4.2 3.5 - 5.5 g/dL   Globulin, Total 2.4 1.5 - 4.5 g/dL   Albumin/Globulin Ratio 1.8 1.2 - 2.2   Bilirubin Total 0.6 0.0 - 1.2 mg/dL   Alkaline Phosphatase 42 39 - 117 IU/L   AST 17 0 - 40 IU/L   ALT 18 0 - 44 IU/L  Lipid Panel w/o Chol/HDL Ratio  Result Value Ref Range   Cholesterol, Total 193 100 - 199 mg/dL   Triglycerides 64 0 - 149 mg/dL   HDL 62 >08 mg/dL   VLDL Cholesterol Cal 13 5 - 40 mg/dL   LDL Calculated 657 (H) 0 - 99 mg/dL  TSH  Result Value Ref Range   TSH 1.500 0.450 - 4.500 uIU/mL  UA/M w/rflx Culture, Routine  Result Value Ref Range   Specific Gravity, UA 1.020 1.005 - 1.030   pH, UA 7.0 5.0 - 7.5   Color, UA Yellow Yellow   Appearance Ur Clear Clear   Leukocytes, UA Negative Negative   Protein, UA Negative Negative/Trace   Glucose, UA Negative  Negative   Ketones, UA Negative Negative   RBC, UA 3+ (A) Negative   Bilirubin, UA Negative Negative   Urobilinogen, Ur 0.2 0.2 - 1.0 mg/dL   Nitrite, UA Negative Negative   Microscopic Examination See below:   PSA  Result Value Ref Range   Prostate Specific Ag, Serum 0.4 0.0 - 4.0 ng/mL      Assessment & Plan:   Problem List Items Addressed This Visit      Genitourinary   Kidney stone    Resolved. No issues today. Call with any concerns.        Other Visit Diagnoses    Annual physical exam    -  Primary   Vaccines up to date. Screening labs checked today. Colonoscopy up to date. Continue diet and exercise. Call with any concerns.    Relevant Orders   CBC with Differential/Platelet   Comprehensive metabolic panel   Lipid Panel w/o Chol/HDL Ratio   TSH   UA/M w/rflx Culture, Routine   Screening for prostate cancer       Labs drawn today. Await results.    Relevant Orders   PSA   Erectile dysfunction, unspecified erectile dysfunction type       Will treat with viagra. Call with any cocerns.        LABORATORY TESTING:  Health maintenance labs ordered today as discussed above.   The natural history of prostate cancer and ongoing controversy regarding screening and potential treatment outcomes of prostate cancer has been discussed with the patient. The meaning of a false positive PSA and a false negative PSA has been discussed. He indicates understanding of the limitations of this screening test and wishes to proceed with screening PSA testing.   IMMUNIZATIONS:   - Tdap: Tetanus vaccination status reviewed: last tetanus booster within 10 years. - Influenza: Up to date - Pneumovax: Up to date - Prevnar: Not applicable - Zostavax vaccine: Given elsewhere  SCREENING: - Colonoscopy: Up to date  Discussed with patient purpose of the colonoscopy is to detect colon cancer at curable precancerous or early stages   PATIENT COUNSELING:    Sexuality: Discussed sexually  transmitted diseases, partner selection, use of condoms, avoidance of unintended pregnancy  and contraceptive alternatives.   Advised to avoid cigarette smoking.  I discussed with the patient that most people either abstain from alcohol or drink within safe limits (<=14/week and <=4 drinks/occasion for males, <=7/weeks and <= 3 drinks/occasion for females) and that the risk for alcohol disorders and other health effects rises proportionally with the number of drinks per week and how often a drinker exceeds daily limits.  Discussed cessation/primary prevention of drug use and availability of treatment for abuse.   Diet: Encouraged to adjust caloric intake to maintain  or achieve ideal body weight, to reduce intake of dietary saturated fat and total fat, to limit sodium intake by avoiding high sodium foods and not adding table salt, and to maintain adequate dietary potassium and calcium preferably from fresh fruits, vegetables, and low-fat dairy products.    stressed the importance of regular exercise  Injury  prevention: Discussed safety belts, safety helmets, smoke detector, smoking near bedding or upholstery.   Dental health: Discussed importance of regular tooth brushing, flossing, and dental visits.   Follow up plan: NEXT PREVENTATIVE PHYSICAL DUE IN 1 YEAR. Return in about 1 year (around 01/11/2019) for Physical.

## 2018-01-12 LAB — PSA: Prostate Specific Ag, Serum: 0.4 ng/mL (ref 0.0–4.0)

## 2018-01-12 LAB — CBC WITH DIFFERENTIAL/PLATELET
Basophils Absolute: 0 10*3/uL (ref 0.0–0.2)
Basos: 1 %
EOS (ABSOLUTE): 0.1 10*3/uL (ref 0.0–0.4)
Eos: 2 %
Hematocrit: 42.6 % (ref 37.5–51.0)
Hemoglobin: 14.3 g/dL (ref 13.0–17.7)
Immature Grans (Abs): 0 10*3/uL (ref 0.0–0.1)
Immature Granulocytes: 0 %
Lymphocytes Absolute: 1.6 10*3/uL (ref 0.7–3.1)
Lymphs: 39 %
MCH: 31.5 pg (ref 26.6–33.0)
MCHC: 33.6 g/dL (ref 31.5–35.7)
MCV: 94 fL (ref 79–97)
Monocytes Absolute: 0.5 10*3/uL (ref 0.1–0.9)
Monocytes: 13 %
Neutrophils Absolute: 1.9 10*3/uL (ref 1.4–7.0)
Neutrophils: 45 %
Platelets: 281 10*3/uL (ref 150–379)
RBC: 4.54 x10E6/uL (ref 4.14–5.80)
RDW: 13.7 % (ref 12.3–15.4)
WBC: 4.1 10*3/uL (ref 3.4–10.8)

## 2018-01-12 LAB — COMPREHENSIVE METABOLIC PANEL
ALT: 15 IU/L (ref 0–44)
AST: 16 IU/L (ref 0–40)
Albumin/Globulin Ratio: 1.7 (ref 1.2–2.2)
Albumin: 4.2 g/dL (ref 3.5–5.5)
Alkaline Phosphatase: 45 IU/L (ref 39–117)
BUN/Creatinine Ratio: 13 (ref 9–20)
BUN: 13 mg/dL (ref 6–24)
Bilirubin Total: 0.6 mg/dL (ref 0.0–1.2)
CO2: 23 mmol/L (ref 20–29)
Calcium: 9.3 mg/dL (ref 8.7–10.2)
Chloride: 101 mmol/L (ref 96–106)
Creatinine, Ser: 1.03 mg/dL (ref 0.76–1.27)
GFR calc Af Amer: 91 mL/min/{1.73_m2} (ref >59)
GFR calc non Af Amer: 79 mL/min/{1.73_m2} (ref >59)
Globulin, Total: 2.5 g/dL (ref 1.5–4.5)
Glucose: 96 mg/dL (ref 65–99)
Potassium: 4.7 mmol/L (ref 3.5–5.2)
Sodium: 141 mmol/L (ref 134–144)
Total Protein: 6.7 g/dL (ref 6.0–8.5)

## 2018-01-12 LAB — LIPID PANEL W/O CHOL/HDL RATIO
Cholesterol, Total: 192 mg/dL (ref 100–199)
HDL: 68 mg/dL (ref >39)
LDL Calculated: 112 mg/dL — ABNORMAL HIGH (ref 0–99)
Triglycerides: 59 mg/dL (ref 0–149)
VLDL Cholesterol Cal: 12 mg/dL (ref 5–40)

## 2018-01-12 LAB — TSH: TSH: 2.21 u[IU]/mL (ref 0.450–4.500)

## 2018-06-19 DIAGNOSIS — J019 Acute sinusitis, unspecified: Secondary | ICD-10-CM | POA: Diagnosis not present

## 2019-01-17 ENCOUNTER — Ambulatory Visit (INDEPENDENT_AMBULATORY_CARE_PROVIDER_SITE_OTHER): Payer: 59 | Admitting: Family Medicine

## 2019-01-17 ENCOUNTER — Encounter: Payer: Self-pay | Admitting: Family Medicine

## 2019-01-17 VITALS — BP 122/82 | HR 68 | Temp 98.4°F | Ht 70.28 in | Wt 191.0 lb

## 2019-01-17 DIAGNOSIS — Z0000 Encounter for general adult medical examination without abnormal findings: Secondary | ICD-10-CM

## 2019-01-17 DIAGNOSIS — H6981 Other specified disorders of Eustachian tube, right ear: Secondary | ICD-10-CM | POA: Diagnosis not present

## 2019-01-17 LAB — UA/M W/RFLX CULTURE, ROUTINE
Bilirubin, UA: NEGATIVE
Glucose, UA: NEGATIVE
Ketones, UA: NEGATIVE
Leukocytes, UA: NEGATIVE
Nitrite, UA: NEGATIVE
Protein, UA: NEGATIVE
RBC, UA: NEGATIVE
Specific Gravity, UA: 1.02 (ref 1.005–1.030)
Urobilinogen, Ur: 0.2 mg/dL (ref 0.2–1.0)
pH, UA: 7 (ref 5.0–7.5)

## 2019-01-17 MED ORDER — PREDNISONE 50 MG PO TABS: 50.0000 mg | ORAL_TABLET | Freq: Every day | ORAL | 0 refills | Status: DC

## 2019-01-17 NOTE — Patient Instructions (Signed)

## 2019-01-17 NOTE — Progress Notes (Signed)
BP 122/82   Pulse 68   Temp 98.4 F (36.9 C) (Oral)   Ht 5' 10.28" (1.785 m)   Wt 191 lb (86.6 kg)   SpO2 99%   BMI 27.19 kg/m    Subjective:    Patient ID: Thomas Whitney, male    DOB: October 31, 1961, 58 y.o.   MRN: [id number]  HPI: SIMAO Whitney is a 58 y.o. male presenting on 01/17/2019 for comprehensive medical examination. Current medical complaints include:  UPPER RESPIRATORY TRACT INFECTION Duration: 1 week Worst symptom: clogged ears, congestion Fever: no Cough: no Shortness of breath: no Wheezing: no Chest pain: no Chest tightness: no Chest congestion: no Nasal congestion: yes Runny nose: yes Post nasal drip: yes Sneezing: no Sore throat: no Swollen glands: no Sinus pressure: yes Headache: yes Face pain: no Toothache: no Ear pain: yes "right Ear pressure: no bilateral Eyes red/itching:no Eye drainage/crusting: no  Vomiting: no Rash: no Fatigue: no Sick contacts: yes Strep contacts: no  Context: stable Recurrent sinusitis: no Relief with OTC cold/cough medications: no  Treatments attempted: pseudoephedrine   He currently lives with: wife Interim Problems from his last visit: no  Depression Screen done today and results listed below:  Depression screen [facility] 2/9 01/17/2019 01/11/2018 01/06/2017 01/06/2016  Decreased Interest 0 0 0 0  Down, Depressed, Hopeless 0 0 0 0  PHQ - 2 Score 0 0 0 0  Altered sleeping 0 0 - -  Tired, decreased energy 0 0 - -  Change in appetite 0 0 - -  Feeling bad or failure about yourself  0 0 - -  Trouble concentrating 0 0 - -  Moving slowly or fidgety/restless 0 0 - -  Suicidal thoughts 0 0 - -  PHQ-9 Score 0 0 - -  Difficult doing work/chores Not difficult at all - - -    Past Medical History:  Past Medical History:  Diagnosis Date  . GERD (gastroesophageal reflux disease)   . Headache     Surgical History:  Past Surgical History:  Procedure Laterality Date  . COLONOSCOPY  2013  . CYSTOSCOPY WITH STENT  PLACEMENT Left 05/18/2017   Procedure: CYSTOSCOPY WITH STENT PLACEMENT;  Surgeon: Orson Ape, MD;  Location: [facility] OR[facility];  Service: Urology;  Laterality: Left;  . EXTRACORPOREAL SHOCK WAVE LITHOTRIPSY Left 05/13/2017   Procedure: EXTRACORPOREAL SHOCK WAVE LITHOTRIPSY (ESWL);  Surgeon: Orson Ape, MD;  Location: [facility];  Service: Urology;  Laterality: Left;  . HERNIA REPAIR Right 11/03/2010   Direct inguinal hernia, large Ultra Pro mesh.  . INGUINAL HERNIA REPAIR Left 02/10/2016   Procedure: HERNIA REPAIR INGUINAL ADULT;  Surgeon: Earline Mayotte, MD;  Location: [facility];  Service: General;  Laterality: Left;  . INGUINAL HERNIA REPAIR Left 02/10/2016  . KIDNEY STONE SURGERY     2001, 2006  . TONSILLECTOMY  1966  . VASECTOMY  1992    Medications:  Current Outpatient Medications on File Prior to Visit  Medication Sig  . aspirin-acetaminophen-caffeine (EXCEDRIN MIGRAINE) 250-250-65 MG tablet Take 2 tablets by mouth every 8 (eight) hours as needed for headache.  . ibuprofen (ADVIL,MOTRIN) 200 MG tablet Take 400 mg by mouth every 8 (eight) hours as needed (for pain.).   Marland Kitchen loratadine (CLARITIN) 10 MG tablet Take 10 mg by mouth daily as needed (for allergies.).   Marland Kitchen omeprazole (PRILOSEC) 20 MG capsule Take 20 mg by mouth daily as needed (for acid reflux/indigestion.).   Marland Kitchen Soft Lens Products (REWETTING DROPS) SOLN Place  1 drop into both eyes daily as needed (for contact lenses.).   No current facility-administered medications on file prior to visit.     Allergies:  No Known Allergies  Social History:  Social History   Socioeconomic History  . Marital status: Married    Spouse name: Not on file  . Number of children: Not on file  . Years of education: Not on file  . Highest education level: Not on file  Occupational History  . Not on file  Social Needs  . Financial resource strain: Not on file  . Food insecurity:    Worry: Not on file    Inability: Not on file  .  Transportation needs:    Medical: Not on file    Non-medical: Not on file  Tobacco Use  . Smoking status: Former Smoker    Years: 20.00    Types: Cigarettes    Last attempt to quit: 01/07/2016    Years since quitting: 3.0  . Smokeless tobacco: Current User    Types: Snuff  Substance and Sexual Activity  . Alcohol use: Yes    Alcohol/week: 0.0 standard drinks    Comment: on occasion  . Drug use: No  . Sexual activity: Yes  Lifestyle  . Physical activity:    Days per week: Not on file    Minutes per session: Not on file  . Stress: Not on file  Relationships  . Social connections:    Talks on phone: Not on file    Gets together: Not on file    Attends religious service: Not on file    Active member of club or organization: Not on file    Attends meetings of clubs or organizations: Not on file    Relationship status: Not on file  . Intimate partner violence:    Fear of current or ex partner: Not on file    Emotionally abused: Not on file    Physically abused: Not on file    Forced sexual activity: Not on file  Other Topics Concern  . Not on file  Social History Narrative  . Not on file   Social History   Tobacco Use  Smoking Status Former Smoker  . Years: 20.00  . Types: Cigarettes  . Last attempt to quit: 01/07/2016  . Years since quitting: 3.0  Smokeless Tobacco Current User  . Types: Snuff   Social History   Substance and Sexual Activity  Alcohol Use Yes  . Alcohol/week: 0.0 standard drinks   Comment: on occasion    Family History:  Family History  Problem Relation Age of Onset  . Dementia Mother   . Hypertension Mother   . Diabetes Father   . Diabetes Maternal Grandfather   . Diabetes Paternal Grandfather     Past medical history, surgical history, medications, allergies, family history and social history reviewed with patient today and changes made to appropriate areas of the chart.   Review of Systems  Constitutional: Negative.   HENT: Positive  for congestion and ear pain. Negative for ear discharge, hearing loss, nosebleeds, sinus pain, sore throat and tinnitus.   Eyes: Negative.   Respiratory: Negative.  Negative for stridor.   Cardiovascular: Negative.   Gastrointestinal: Negative.   Genitourinary: Negative.   Musculoskeletal: Negative.   Skin: Negative.   Neurological: Negative.   Endo/Heme/Allergies: Negative.   Psychiatric/Behavioral: Negative.     All other ROS negative except what is listed above and in the HPI.  Objective:    BP 122/82   Pulse 68   Temp 98.4 F (36.9 C) (Oral)   Ht 5' 10.28" (1.785 m)   Wt 191 lb (86.6 kg)   SpO2 99%   BMI 27.19 kg/m   Wt Readings from Last 3 Encounters:  01/17/19 191 lb (86.6 kg)  01/11/18 180 lb 8 oz (81.9 kg)  05/18/17 180 lb (81.6 kg)    Physical Exam Vitals signs and nursing note reviewed.  Constitutional:      General: He is not in acute distress.    Appearance: Normal appearance. He is obese. He is not ill-appearing, toxic-appearing or diaphoretic.  HENT:     Head: Normocephalic and atraumatic.     Right Ear: Ear canal and external ear normal. There is no impacted cerumen. Tympanic membrane is bulging.     Left Ear: Tympanic membrane, ear canal and external ear normal. There is no impacted cerumen.     Nose: Nose normal. No congestion or rhinorrhea.     Mouth/Throat:     Mouth: Mucous membranes are moist.     Pharynx: Oropharynx is clear. No oropharyngeal exudate or posterior oropharyngeal erythema.  Eyes:     General: No scleral icterus.       Right eye: No discharge.        Left eye: No discharge.     Extraocular Movements: Extraocular movements intact.     Conjunctiva/sclera: Conjunctivae normal.     Pupils: Pupils are equal, round, and reactive to light.  Neck:     Musculoskeletal: Normal range of motion and neck supple. No neck rigidity or muscular tenderness.     Vascular: No carotid bruit.  Cardiovascular:     Rate and Rhythm: Normal rate  and regular rhythm.     Pulses: Normal pulses.     Heart sounds: No murmur. No friction rub. No gallop.   Pulmonary:     Effort: Pulmonary effort is normal. No respiratory distress.     Breath sounds: Normal breath sounds. No stridor. No wheezing, rhonchi or rales.  Chest:     Chest wall: No tenderness.  Abdominal:     General: Abdomen is flat. Bowel sounds are normal. There is no distension.     Palpations: Abdomen is soft. There is no mass.     Tenderness: There is no abdominal tenderness. There is no right CVA tenderness, left CVA tenderness, guarding or rebound.     Hernia: No hernia is present.  Genitourinary:    Comments: Genital exam deferred with shared decision making Musculoskeletal:        General: No swelling, tenderness, deformity or signs of injury.     Right lower leg: No edema.     Left lower leg: No edema.  Lymphadenopathy:     Cervical: No cervical adenopathy.  Skin:    General: Skin is warm and dry.     Capillary Refill: Capillary refill takes less than 2 seconds.     Coloration: Skin is not jaundiced or pale.     Findings: No bruising, erythema, lesion or rash.  Neurological:     General: No focal deficit present.     Mental Status: He is alert and oriented to person, place, and time.     Cranial Nerves: No cranial nerve deficit.     Sensory: No sensory deficit.     Motor: No weakness.     Coordination: Coordination normal.     Gait: Gait normal.     Deep  Tendon Reflexes: Reflexes normal.  Psychiatric:        Mood and Affect: Mood normal.        Behavior: Behavior normal.        Thought Content: Thought content normal.        Judgment: Judgment normal.     Results for orders placed or performed in visit on 01/11/18  CBC with Differential/Platelet  Result Value Ref Range   WBC 4.1 3.4 - 10.8 x10E3/uL   RBC 4.54 4.14 - 5.80 x10E6/uL   Hemoglobin 14.3 13.0 - 17.7 g/dL   Hematocrit 82.5 00.3 - 51.0 %   MCV 94 79 - 97 fL   MCH 31.5 26.6 - 33.0 pg    MCHC 33.6 31.5 - 35.7 g/dL   RDW 70.4 88.8 - 91.6 %   Platelets 281 150 - 379 x10E3/uL   Neutrophils 45 Not Estab. %   Lymphs 39 Not Estab. %   Monocytes 13 Not Estab. %   Eos 2 Not Estab. %   Basos 1 Not Estab. %   Neutrophils Absolute 1.9 1.4 - 7.0 x10E3/uL   Lymphocytes Absolute 1.6 0.7 - 3.1 x10E3/uL   Monocytes Absolute 0.5 0.1 - 0.9 x10E3/uL   EOS (ABSOLUTE) 0.1 0.0 - 0.4 x10E3/uL   Basophils Absolute 0.0 0.0 - 0.2 x10E3/uL   Immature Granulocytes 0 Not Estab. %   Immature Grans (Abs) 0.0 0.0 - 0.1 x10E3/uL  Comprehensive metabolic panel  Result Value Ref Range   Glucose 96 65 - 99 mg/dL   BUN 13 6 - 24 mg/dL   Creatinine, Ser 9.45 0.76 - 1.27 mg/dL   GFR calc non Af Amer 79 >59 mL/min/1.73   GFR calc Af Amer 91 >59 mL/min/1.73   BUN/Creatinine Ratio 13 9 - 20   Sodium 141 134 - 144 mmol/L   Potassium 4.7 3.5 - 5.2 mmol/L   Chloride 101 96 - 106 mmol/L   CO2 23 20 - 29 mmol/L   Calcium 9.3 8.7 - 10.2 mg/dL   Total Protein 6.7 6.0 - 8.5 g/dL   Albumin 4.2 3.5 - 5.5 g/dL   Globulin, Total 2.5 1.5 - 4.5 g/dL   Albumin/Globulin Ratio 1.7 1.2 - 2.2   Bilirubin Total 0.6 0.0 - 1.2 mg/dL   Alkaline Phosphatase 45 39 - 117 IU/L   AST 16 0 - 40 IU/L   ALT 15 0 - 44 IU/L  Lipid Panel w/o Chol/HDL Ratio  Result Value Ref Range   Cholesterol, Total 192 100 - 199 mg/dL   Triglycerides 59 0 - 149 mg/dL   HDL 68 >03 mg/dL   VLDL Cholesterol Cal 12 5 - 40 mg/dL   LDL Calculated 888 (H) 0 - 99 mg/dL  TSH  Result Value Ref Range   TSH 2.210 0.450 - 4.500 uIU/mL  UA/M w/rflx Culture, Routine  Result Value Ref Range   Specific Gravity, UA 1.025 1.005 - 1.030   pH, UA 6.0 5.0 - 7.5   Color, UA Yellow Yellow   Appearance Ur Clear Clear   Leukocytes, UA Negative Negative   Protein, UA Negative Negative/Trace   Glucose, UA Negative Negative   Ketones, UA Negative Negative   RBC, UA Negative Negative   Bilirubin, UA Negative Negative   Urobilinogen, Ur 0.2 0.2 - 1.0 mg/dL    Nitrite, UA Negative Negative  PSA  Result Value Ref Range   Prostate Specific Ag, Serum 0.4 0.0 - 4.0 ng/mL      Assessment & Plan:  Problem List Items Addressed This Visit    None    Visit Diagnoses    Routine general medical examination at a health care facility    -  Primary   Vaccines up to date. Screening labs checked today. Colonoscopy up to date. Continue diet and exercise. Call with any concerns.    Relevant Orders   Comprehensive metabolic panel   CBC with Differential/Platelet   Lipid Panel w/o Chol/HDL Ratio   PSA   TSH   UA/M w/rflx Culture, Routine   Dysfunction of right eustachian tube       Will treat with prednisone, given the fluid- if worse, will call in abx. Call with any concerns.        LABORATORY TESTING:  Health maintenance labs ordered today as discussed above.   The natural history of prostate cancer and ongoing controversy regarding screening and potential treatment outcomes of prostate cancer has been discussed with the patient. The meaning of a false positive PSA and a false negative PSA has been discussed. He indicates understanding of the limitations of this screening test and wishes to proceed with screening PSA testing.   IMMUNIZATIONS:   - Tdap: Tetanus vaccination status reviewed: Tdap vaccination indicated and given today. - Influenza: Up to date  SCREENING: - Colonoscopy: Up to date  Discussed with patient purpose of the colonoscopy is to detect colon cancer at curable precancerous or early stages   PATIENT COUNSELING:    Sexuality: Discussed sexually transmitted diseases, partner selection, use of condoms, avoidance of unintended pregnancy  and contraceptive alternatives.   Advised to avoid cigarette smoking.  I discussed with the patient that most people either abstain from alcohol or drink within safe limits (<=14/week and <=4 drinks/occasion for males, <=7/weeks and <= 3 drinks/occasion for females) and that the risk for alcohol  disorders and other health effects rises proportionally with the number of drinks per week and how often a drinker exceeds daily limits.  Discussed cessation/primary prevention of drug use and availability of treatment for abuse.   Diet: Encouraged to adjust caloric intake to maintain  or achieve ideal body weight, to reduce intake of dietary saturated fat and total fat, to limit sodium intake by avoiding high sodium foods and not adding table salt, and to maintain adequate dietary potassium and calcium preferably from fresh fruits, vegetables, and low-fat dairy products.    stressed the importance of regular exercise  Injury prevention: Discussed safety belts, safety helmets, smoke detector, smoking near bedding or upholstery.   Dental health: Discussed importance of regular tooth brushing, flossing, and dental visits.   Follow up plan: NEXT PREVENTATIVE PHYSICAL DUE IN 1 YEAR. Return in about 1 year (around 01/18/2020) for Physical.

## 2019-01-18 LAB — COMPREHENSIVE METABOLIC PANEL
ALT: 13 IU/L (ref 0–44)
AST: 16 IU/L (ref 0–40)
Albumin/Globulin Ratio: 1.7 (ref 1.2–2.2)
Albumin: 4.3 g/dL (ref 3.8–4.9)
Alkaline Phosphatase: 44 IU/L (ref 39–117)
BUN/Creatinine Ratio: 12 (ref 10–24)
BUN: 11 mg/dL (ref 8–27)
Bilirubin Total: 0.6 mg/dL (ref 0.0–1.2)
CO2: 26 mmol/L (ref 20–29)
Calcium: 9.1 mg/dL (ref 8.6–10.2)
Chloride: 100 mmol/L (ref 96–106)
Creatinine, Ser: 0.94 mg/dL (ref 0.76–1.27)
GFR calc Af Amer: 101 mL/min/{1.73_m2} (ref >59)
GFR calc non Af Amer: 88 mL/min/{1.73_m2} (ref >59)
Globulin, Total: 2.6 g/dL (ref 1.5–4.5)
Glucose: 96 mg/dL (ref 65–99)
Potassium: 4.6 mmol/L (ref 3.5–5.2)
Sodium: 140 mmol/L (ref 134–144)
Total Protein: 6.9 g/dL (ref 6.0–8.5)

## 2019-01-18 LAB — CBC WITH DIFFERENTIAL/PLATELET
Basophils Absolute: 0 10*3/uL (ref 0.0–0.2)
Basos: 1 %
EOS (ABSOLUTE): 0.1 10*3/uL (ref 0.0–0.4)
Eos: 1 %
Hematocrit: 43 % (ref 37.5–51.0)
Hemoglobin: 14.6 g/dL (ref 13.0–17.7)
Immature Grans (Abs): 0 10*3/uL (ref 0.0–0.1)
Immature Granulocytes: 0 %
Lymphocytes Absolute: 1.5 10*3/uL (ref 0.7–3.1)
Lymphs: 33 %
MCH: 30.8 pg (ref 26.6–33.0)
MCHC: 34 g/dL (ref 31.5–35.7)
MCV: 91 fL (ref 79–97)
Monocytes Absolute: 0.5 10*3/uL (ref 0.1–0.9)
Monocytes: 11 %
Neutrophils Absolute: 2.4 10*3/uL (ref 1.4–7.0)
Neutrophils: 54 %
Platelets: 283 10*3/uL (ref 150–450)
RBC: 4.74 x10E6/uL (ref 4.14–5.80)
RDW: 12.6 % (ref 11.6–15.4)
WBC: 4.6 10*3/uL (ref 3.4–10.8)

## 2019-01-18 LAB — LIPID PANEL W/O CHOL/HDL RATIO
Cholesterol, Total: 190 mg/dL (ref 100–199)
HDL: 63 mg/dL (ref >39)
LDL Calculated: 113 mg/dL — ABNORMAL HIGH (ref 0–99)
Triglycerides: 70 mg/dL (ref 0–149)
VLDL Cholesterol Cal: 14 mg/dL (ref 5–40)

## 2019-01-18 LAB — TSH: TSH: 1.09 u[IU]/mL (ref 0.450–4.500)

## 2019-01-18 LAB — PSA: Prostate Specific Ag, Serum: 0.4 ng/mL (ref 0.0–4.0)

## 2020-01-19 ENCOUNTER — Encounter: Payer: Self-pay | Admitting: Family Medicine

## 2020-01-19 ENCOUNTER — Ambulatory Visit (INDEPENDENT_AMBULATORY_CARE_PROVIDER_SITE_OTHER): Payer: 59 | Admitting: Family Medicine

## 2020-01-19 ENCOUNTER — Other Ambulatory Visit: Payer: Self-pay

## 2020-01-19 VITALS — BP 118/76 | HR 71 | Temp 98.3°F | Ht 68.6 in | Wt 175.0 lb

## 2020-01-19 DIAGNOSIS — Z0000 Encounter for general adult medical examination without abnormal findings: Secondary | ICD-10-CM

## 2020-01-19 DIAGNOSIS — Z23 Encounter for immunization: Secondary | ICD-10-CM

## 2020-01-19 DIAGNOSIS — Z1211 Encounter for screening for malignant neoplasm of colon: Secondary | ICD-10-CM

## 2020-01-19 LAB — UA/M W/RFLX CULTURE, ROUTINE
Bilirubin, UA: NEGATIVE
Glucose, UA: NEGATIVE
Ketones, UA: NEGATIVE
Leukocytes,UA: NEGATIVE
Nitrite, UA: NEGATIVE
Protein,UA: NEGATIVE
Specific Gravity, UA: 1.025 (ref 1.005–1.030)
Urobilinogen, Ur: 0.2 mg/dL (ref 0.2–1.0)
pH, UA: 6 (ref 5.0–7.5)

## 2020-01-19 LAB — MICROSCOPIC EXAMINATION
Bacteria, UA: NONE SEEN
WBC, UA: NONE SEEN /hpf (ref 0–5)

## 2020-01-19 NOTE — Patient Instructions (Addendum)
Influenza Virus Vaccine injection What is this medicine? INFLUENZA VIRUS VACCINE (in floo EN zuh VAHY ruhs vak SEEN) helps to reduce the risk of getting influenza also known as the flu. The vaccine only helps protect you against some strains of the flu. This medicine may be used for other purposes; ask your health care provider or pharmacist if you have questions. COMMON BRAND NAME(S): Afluria, Afluria Quadrivalent, Agriflu, Alfuria, FLUAD, Fluarix, Fluarix Quadrivalent, Flublok, Flublok Quadrivalent, FLUCELVAX, FLUCELVAX Quadrivalent, Flulaval, Flulaval Quadrivalent, Fluvirin, Fluzone, Fluzone High-Dose, Fluzone Intradermal, Fluzone Quadrivalent What should I tell my health care provider before I take this medicine? They need to know if you have any of these conditions:  bleeding disorder like hemophilia  fever or infection  Guillain-Barre syndrome or other neurological problems  immune system problems  infection with the human immunodeficiency virus (HIV) or AIDS  low blood platelet counts  multiple sclerosis  an unusual or allergic reaction to influenza virus vaccine, latex, other medicines, foods, dyes, or preservatives. Different brands of vaccines contain different allergens. Some may contain latex or eggs. Talk to your doctor about your allergies to make sure that you get the right vaccine.  pregnant or trying to get pregnant  breast-feeding How should I use this medicine? This vaccine is for injection into a muscle or under the skin. It is given by a health care professional. A copy of Vaccine Information Statements will be given before each vaccination. Read this sheet carefully each time. The sheet may change frequently. Talk to your healthcare provider to see which vaccines are right for you. Some vaccines should not be used in all age groups. Overdosage: If you think you have taken too much of this medicine contact a poison control center or emergency room at once. NOTE:  This medicine is only for you. Do not share this medicine with others. What if I miss a dose? This does not apply. What may interact with this medicine?  chemotherapy or radiation therapy  medicines that lower your immune system like etanercept, anakinra, infliximab, and adalimumab  medicines that treat or prevent blood clots like warfarin  phenytoin  steroid medicines like prednisone or cortisone  theophylline  vaccines This list may not describe all possible interactions. Give your health care provider a list of all the medicines, herbs, non-prescription drugs, or dietary supplements you use. Also tell them if you smoke, drink alcohol, or use illegal drugs. Some items may interact with your medicine. What should I watch for while using this medicine? Report any side effects that do not go away within 3 days to your doctor or health care professional. Call your health care provider if any unusual symptoms occur within 6 weeks of receiving this vaccine. You may still catch the flu, but the illness is not usually as bad. You cannot get the flu from the vaccine. The vaccine will not protect against colds or other illnesses that may cause fever. The vaccine is needed every year. What side effects may I notice from receiving this medicine? Side effects that you should report to your doctor or health care professional as soon as possible:  allergic reactions like skin rash, itching or hives, swelling of the face, lips, or tongue Side effects that usually do not require medical attention (report to your doctor or health care professional if they continue or are bothersome):  fever  headache  muscle aches and pains  pain, tenderness, redness, or swelling at the injection site  tiredness This list may not describe  all possible side effects. Call your doctor for medical advice about side effects. You may report side effects to FDA at 1-800-FDA-1088. Where should I keep my medicine? The  vaccine will be given by a health care professional in a clinic, pharmacy, doctor's office, or other health care setting. You will not be given vaccine doses to store at home. NOTE: This sheet is a summary. It may not cover all possible information. If you have questions about this medicine, talk to your doctor, pharmacist, or health care provider.  2020 Elsevier/Gold Standard (2018-10-04 08:45:43)  Health Maintenance, Male Adopting a healthy lifestyle and getting preventive care are important in promoting health and wellness. Ask your health care provider about:  The right schedule for you to have regular tests and exams.  Things you can do on your own to prevent diseases and keep yourself healthy. What should I know about diet, weight, and exercise? Eat a healthy diet   Eat a diet that includes plenty of vegetables, fruits, low-fat dairy products, and lean protein.  Do not eat a lot of foods that are high in solid fats, added sugars, or sodium. Maintain a healthy weight Body mass index (BMI) is a measurement that can be used to identify possible weight problems. It estimates body fat based on height and weight. Your health care provider can help determine your BMI and help you achieve or maintain a healthy weight. Get regular exercise Get regular exercise. This is one of the most important things you can do for your health. Most adults should:  Exercise for at least 150 minutes each week. The exercise should increase your heart rate and make you sweat (moderate-intensity exercise).  Do strengthening exercises at least twice a week. This is in addition to the moderate-intensity exercise.  Spend less time sitting. Even light physical activity can be beneficial. Watch cholesterol and blood lipids Have your blood tested for lipids and cholesterol at 61 years of age, then have this test every 5 years. You may need to have your cholesterol levels checked more often if:  Your lipid or  cholesterol levels are high.  You are older than 61 years of age.  You are at high risk for heart disease. What should I know about cancer screening? Many types of cancers can be detected early and may often be prevented. Depending on your health history and family history, you may need to have cancer screening at various ages. This may include screening for:  Colorectal cancer.  Prostate cancer.  Skin cancer.  Lung cancer. What should I know about heart disease, diabetes, and high blood pressure? Blood pressure and heart disease  High blood pressure causes heart disease and increases the risk of stroke. This is more likely to develop in people who have high blood pressure readings, are of African descent, or are overweight.  Talk with your health care provider about your target blood pressure readings.  Have your blood pressure checked: ? Every 3-5 years if you are 15-30 years of age. ? Every year if you are 46 years old or older.  If you are between the ages of 9 and 36 and are a current or former smoker, ask your health care provider if you should have a one-time screening for abdominal aortic aneurysm (AAA). Diabetes Have regular diabetes screenings. This checks your fasting blood sugar level. Have the screening done:  Once every three years after age 45 if you are at a normal weight and have a low risk for  diabetes.  More often and at a younger age if you are overweight or have a high risk for diabetes. What should I know about preventing infection? Hepatitis B If you have a higher risk for hepatitis B, you should be screened for this virus. Talk with your health care provider to find out if you are at risk for hepatitis B infection. Hepatitis C Blood testing is recommended for:  Everyone born from [birth year] through 1967.  Anyone with known risk factors for hepatitis C. Sexually transmitted infections (STIs)  You should be screened each year for STIs, including gonorrhea  and chlamydia, if: ? You are sexually active and are younger than 61 years of age. ? You are older than 61 years of age and your health care provider tells you that you are at risk for this type of infection. ? Your sexual activity has changed since you were last screened, and you are at increased risk for chlamydia or gonorrhea. Ask your health care provider if you are at risk.  Ask your health care provider about whether you are at high risk for HIV. Your health care provider may recommend a prescription medicine to help prevent HIV infection. If you choose to take medicine to prevent HIV, you should first get tested for HIV. You should then be tested every 3 months for as long as you are taking the medicine. Follow these instructions at home: Lifestyle  Do not use any products that contain nicotine or tobacco, such as cigarettes, e-cigarettes, and chewing tobacco. If you need help quitting, ask your health care provider.  Do not use street drugs.  Do not share needles.  Ask your health care provider for help if you need support or information about quitting drugs. Alcohol use  Do not drink alcohol if your health care provider tells you not to drink.  If you drink alcohol: ? Limit how much you have to 0-2 drinks a day. ? Be aware of how much alcohol is in your drink. In the [place] one drink equals one 12 oz bottle of beer (355 mL), one 5 oz glass of wine (148 mL), or one 1 oz glass of hard liquor (44 mL). General instructions  Schedule regular health, dental, and eye exams.  Stay current with your vaccines.  Tell your health care provider if: ? You often feel depressed. ? You have ever been abused or do not feel safe at home. Summary  Adopting a healthy lifestyle and getting preventive care are important in promoting health and wellness.  Follow your health care provider's instructions about healthy diet, exercising, and getting tested or screened for diseases.  Follow your  health care provider's instructions on monitoring your cholesterol and blood pressure. This information is not intended to replace advice given to you by your health care provider. Make sure you discuss any questions you have with your health care provider. Document Revised: 11/02/2018 Document Reviewed: 11/02/2018 Elsevier Patient Education  2020 [employer].

## 2020-01-19 NOTE — Progress Notes (Signed)
BP 118/76 (BP Location: Left Arm, Patient Position: Sitting, Cuff Size: Normal)   Pulse 71   Temp 98.3 F (36.8 C) (Oral)   Ht 5' 8.6" (1.742 m)   Wt 175 lb (79.4 kg)   SpO2 98%   BMI 26.14 kg/m    Subjective:    Patient ID: Thomas Whitney, male    DOB: [birth year], 59 y.o.   MRN: [id number]  HPI: Thomas Whitney is a 59 y.o. male presenting on 01/19/2020 for comprehensive medical examination. Current medical complaints include:none  He currently lives with: Interim Problems from his last visit: no  Depression Screen done today and results listed below:  Depression screen [facility] 2/9 01/19/2020 01/17/2019 01/11/2018 01/06/2017 01/06/2016  Decreased Interest 0 0 0 0 0  Down, Depressed, Hopeless 0 0 0 0 0  PHQ - 2 Score 0 0 0 0 0  Altered sleeping 0 0 0 - -  Tired, decreased energy 0 0 0 - -  Change in appetite 0 0 0 - -  Feeling bad or failure about yourself  0 0 0 - -  Trouble concentrating 0 0 0 - -  Moving slowly or fidgety/restless 0 0 0 - -  Suicidal thoughts 0 0 0 - -  PHQ-9 Score 0 0 0 - -  Difficult doing work/chores Not difficult at all Not difficult at all - - -    Past Medical History:  Past Medical History:  Diagnosis Date  . GERD (gastroesophageal reflux disease)   . Headache     Surgical History:  Past Surgical History:  Procedure Laterality Date  . COLONOSCOPY  2013  . CYSTOSCOPY WITH STENT PLACEMENT Left 05/18/2017   Procedure: CYSTOSCOPY WITH STENT PLACEMENT;  Surgeon: Orson Ape, MD;  Location: [facility] ORS;  Service: Urology;  Laterality: Left;  . EXTRACORPOREAL SHOCK WAVE LITHOTRIPSY Left 05/13/2017   Procedure: EXTRACORPOREAL SHOCK WAVE LITHOTRIPSY (ESWL);  Surgeon: Orson Ape, MD;  Location: [facility] ORS;  Service: Urology;  Laterality: Left;  . HERNIA REPAIR Right 11/03/2010   Direct inguinal hernia, large Ultra Pro mesh.  . INGUINAL HERNIA REPAIR Left 02/10/2016   Procedure: HERNIA REPAIR INGUINAL ADULT;  Surgeon: Earline Mayotte, MD;  Location:  [facility] ORS;  Service: General;  Laterality: Left;  . INGUINAL HERNIA REPAIR Left 02/10/2016  . KIDNEY STONE SURGERY     2001, 2006  . TONSILLECTOMY  1966  . VASECTOMY  1992    Medications:  Current Outpatient Medications on File Prior to Visit  Medication Sig  . aspirin-acetaminophen-caffeine (EXCEDRIN MIGRAINE) 250-250-65 MG tablet Take 2 tablets by mouth every 8 (eight) hours as needed for headache.  . ibuprofen (ADVIL,MOTRIN) 200 MG tablet Take 400 mg by mouth every 8 (eight) hours as needed (for pain.).   Marland Kitchen loratadine (CLARITIN) 10 MG tablet Take 10 mg by mouth daily as needed (for allergies.).   Marland Kitchen omeprazole (PRILOSEC) 20 MG capsule Take 20 mg by mouth daily as needed (for acid reflux/indigestion.).   Marland Kitchen Soft Lens Products (REWETTING DROPS) SOLN Place 1 drop into both eyes daily as needed (for contact lenses.).   No current facility-administered medications on file prior to visit.    Allergies:  No Known Allergies  Social History:  Social History   Socioeconomic History  . Marital status: Married    Spouse name: Not on file  . Number of children: Not on file  . Years of education: Not on file  . Highest education level: Not on file  Occupational History  . Not on file  Tobacco Use  . Smoking status: Former Smoker    Years: 20.00    Types: Cigarettes    Quit date: 01/07/2016    Years since quitting: 4.0  . Smokeless tobacco: Current User    Types: Snuff  Substance and Sexual Activity  . Alcohol use: Yes    Alcohol/week: 0.0 standard drinks    Comment: on occasion  . Drug use: No  . Sexual activity: Yes  Other Topics Concern  . Not on file  Social History Narrative  . Not on file   Social Determinants of Health   Financial Resource Strain:   . Difficulty of Paying Living Expenses: Not on file  Food Insecurity:   . Worried About Programme researcher, broadcasting/film/video in the Last Year: Not on file  . Ran Out of Food in the Last Year: Not on file  Transportation Needs:   . Lack  of Transportation (Medical): Not on file  . Lack of Transportation (Non-Medical): Not on file  Physical Activity:   . Days of Exercise per Week: Not on file  . Minutes of Exercise per Session: Not on file  Stress:   . Feeling of Stress : Not on file  Social Connections:   . Frequency of Communication with Friends and Family: Not on file  . Frequency of Social Gatherings with Friends and Family: Not on file  . Attends Religious Services: Not on file  . Active Member of Clubs or Organizations: Not on file  . Attends Banker Meetings: Not on file  . Marital Status: Not on file  Intimate Partner Violence:   . Fear of Current or Ex-Partner: Not on file  . Emotionally Abused: Not on file  . Physically Abused: Not on file  . Sexually Abused: Not on file   Social History   Tobacco Use  Smoking Status Former Smoker  . Years: 20.00  . Types: Cigarettes  . Quit date: 01/07/2016  . Years since quitting: 4.0  Smokeless Tobacco Current User  . Types: Snuff   Social History   Substance and Sexual Activity  Alcohol Use Yes  . Alcohol/week: 0.0 standard drinks   Comment: on occasion    Family History:  Family History  Problem Relation Age of Onset  . Dementia Mother   . Hypertension Mother   . Diabetes Father   . Diabetes Maternal Grandfather   . Diabetes Paternal Grandfather     Past medical history, surgical history, medications, allergies, family history and social history reviewed with patient today and changes made to appropriate areas of the chart.   Review of Systems  Constitutional: Negative.   HENT: Negative.   Eyes: Negative.   Respiratory: Negative.   Cardiovascular: Negative.   Gastrointestinal: Negative.   Genitourinary: Negative.   Musculoskeletal: Negative.   Skin: Negative.   Neurological: Negative.   Endo/Heme/Allergies: Negative.   Psychiatric/Behavioral: Negative.     All other ROS negative except what is listed above and in the HPI.       Objective:    BP 118/76 (BP Location: Left Arm, Patient Position: Sitting, Cuff Size: Normal)   Pulse 71   Temp 98.3 F (36.8 C) (Oral)   Ht 5' 8.6" (1.742 m)   Wt 175 lb (79.4 kg)   SpO2 98%   BMI 26.14 kg/m   Wt Readings from Last 3 Encounters:  01/19/20 175 lb (79.4 kg)  01/17/19 191 lb (86.6 kg)  01/11/18 180 lb  8 oz (81.9 kg)    Physical Exam Vitals and nursing note reviewed.  Constitutional:      General: He is not in acute distress.    Appearance: Normal appearance. He is normal weight. He is not ill-appearing, toxic-appearing or diaphoretic.  HENT:     Head: Normocephalic and atraumatic.     Right Ear: Tympanic membrane, ear canal and external ear normal. There is no impacted cerumen.     Left Ear: Tympanic membrane, ear canal and external ear normal. There is no impacted cerumen.     Nose: Nose normal. No congestion or rhinorrhea.     Mouth/Throat:     Mouth: Mucous membranes are moist.     Pharynx: Oropharynx is clear. No oropharyngeal exudate or posterior oropharyngeal erythema.  Eyes:     General: No scleral icterus.       Right eye: No discharge.        Left eye: No discharge.     Extraocular Movements: Extraocular movements intact.     Conjunctiva/sclera: Conjunctivae normal.     Pupils: Pupils are equal, round, and reactive to light.  Neck:     Vascular: No carotid bruit.  Cardiovascular:     Rate and Rhythm: Normal rate and regular rhythm.     Pulses: Normal pulses.     Heart sounds: No murmur. No friction rub. No gallop.   Pulmonary:     Effort: Pulmonary effort is normal. No respiratory distress.     Breath sounds: Normal breath sounds. No stridor. No wheezing, rhonchi or rales.  Chest:     Chest wall: No tenderness.  Abdominal:     General: Abdomen is flat. Bowel sounds are normal. There is no distension.     Palpations: Abdomen is soft. There is no mass.     Tenderness: There is no abdominal tenderness. There is no right CVA tenderness,  left CVA tenderness, guarding or rebound.     Hernia: No hernia is present.  Genitourinary:    Comments: Genital exam deferred with shared decision making Musculoskeletal:        General: No swelling, tenderness, deformity or signs of injury.     Cervical back: Normal range of motion and neck supple. No rigidity. No muscular tenderness.     Right lower leg: No edema.     Left lower leg: No edema.  Lymphadenopathy:     Cervical: No cervical adenopathy.  Skin:    General: Skin is warm and dry.     Capillary Refill: Capillary refill takes less than 2 seconds.     Coloration: Skin is not jaundiced or pale.     Findings: No bruising, erythema, lesion or rash.  Neurological:     General: No focal deficit present.     Mental Status: He is alert and oriented to person, place, and time.     Cranial Nerves: No cranial nerve deficit.     Sensory: No sensory deficit.     Motor: No weakness.     Coordination: Coordination normal.     Gait: Gait normal.     Deep Tendon Reflexes: Reflexes normal.  Psychiatric:        Mood and Affect: Mood normal.        Behavior: Behavior normal.        Thought Content: Thought content normal.        Judgment: Judgment normal.     Results for orders placed or performed in visit on 01/17/19  Comprehensive metabolic  panel  Result Value Ref Range   Glucose 96 65 - 99 mg/dL   BUN 11 8 - 27 mg/dL   Creatinine, Ser 1.61 0.76 - 1.27 mg/dL   GFR calc non Af Amer 88 >59 mL/min/1.73   GFR calc Af Amer 101 >59 mL/min/1.73   BUN/Creatinine Ratio 12 10 - 24   Sodium 140 134 - 144 mmol/L   Potassium 4.6 3.5 - 5.2 mmol/L   Chloride 100 96 - 106 mmol/L   CO2 26 20 - 29 mmol/L   Calcium 9.1 8.6 - 10.2 mg/dL   Total Protein 6.9 6.0 - 8.5 g/dL   Albumin 4.3 3.8 - 4.9 g/dL   Globulin, Total 2.6 1.5 - 4.5 g/dL   Albumin/Globulin Ratio 1.7 1.2 - 2.2   Bilirubin Total 0.6 0.0 - 1.2 mg/dL   Alkaline Phosphatase 44 39 - 117 IU/L   AST 16 0 - 40 IU/L   ALT 13 0 - 44  IU/L  CBC with Differential/Platelet  Result Value Ref Range   WBC 4.6 3.4 - 10.8 x10E3/uL   RBC 4.74 4.14 - 5.80 x10E6/uL   Hemoglobin 14.6 13.0 - 17.7 g/dL   Hematocrit 09.6 04.5 - 51.0 %   MCV 91 79 - 97 fL   MCH 30.8 26.6 - 33.0 pg   MCHC 34.0 31.5 - 35.7 g/dL   RDW 40.9 81.1 - 91.4 %   Platelets 283 150 - 450 x10E3/uL   Neutrophils 54 Not Estab. %   Lymphs 33 Not Estab. %   Monocytes 11 Not Estab. %   Eos 1 Not Estab. %   Basos 1 Not Estab. %   Neutrophils Absolute 2.4 1.4 - 7.0 x10E3/uL   Lymphocytes Absolute 1.5 0.7 - 3.1 x10E3/uL   Monocytes Absolute 0.5 0.1 - 0.9 x10E3/uL   EOS (ABSOLUTE) 0.1 0.0 - 0.4 x10E3/uL   Basophils Absolute 0.0 0.0 - 0.2 x10E3/uL   Immature Granulocytes 0 Not Estab. %   Immature Grans (Abs) 0.0 0.0 - 0.1 x10E3/uL  Lipid Panel w/o Chol/HDL Ratio  Result Value Ref Range   Cholesterol, Total 190 100 - 199 mg/dL   Triglycerides 70 0 - 149 mg/dL   HDL 63 >78 mg/dL   VLDL Cholesterol Cal 14 5 - 40 mg/dL   LDL Calculated 295 (H) 0 - 99 mg/dL  PSA  Result Value Ref Range   Prostate Specific Ag, Serum 0.4 0.0 - 4.0 ng/mL  TSH  Result Value Ref Range   TSH 1.090 0.450 - 4.500 uIU/mL  UA/M w/rflx Culture, Routine   Specimen: Urine   URINE  Result Value Ref Range   Specific Gravity, UA 1.020 1.005 - 1.030   pH, UA 7.0 5.0 - 7.5   Color, UA Yellow Yellow   Appearance Ur Clear Clear   Leukocytes, UA Negative Negative   Protein, UA Negative Negative/Trace   Glucose, UA Negative Negative   Ketones, UA Negative Negative   RBC, UA Negative Negative   Bilirubin, UA Negative Negative   Urobilinogen, Ur 0.2 0.2 - 1.0 mg/dL   Nitrite, UA Negative Negative      Assessment & Plan:   Problem List Items Addressed This Visit    None    Visit Diagnoses    Routine general medical examination at a health care facility    -  Primary   Vaccines up to date. Screening labs checked today. Colonoscopy due in May- referral placed today. Call with any  concerns. Continue to monitor.  Relevant Orders   Nicotine/cotinine metabolites   Hgb A1c w/o eAG   CBC with Differential/Platelet   Comprehensive metabolic panel   Lipid Panel w/o Chol/HDL Ratio   PSA   TSH   UA/M w/rflx Culture, Routine   Screening for colon cancer       Due in May- new referral placed today.   Relevant Orders   Ambulatory referral to Gastroenterology       LABORATORY TESTING:  Health maintenance labs ordered today as discussed above.   The natural history of prostate cancer and ongoing controversy regarding screening and potential treatment outcomes of prostate cancer has been discussed with the patient. The meaning of a false positive PSA and a false negative PSA has been discussed. He indicates understanding of the limitations of this screening test and wishes to proceed with screening PSA testing.   IMMUNIZATIONS:   - Tdap: Tetanus vaccination status reviewed: last tetanus booster within 10 years. - Influenza: Administered today - Pneumovax: Up to date  SCREENING: - Colonoscopy: Up to date  Discussed with patient purpose of the colonoscopy is to detect colon cancer at curable precancerous or early stages   PATIENT COUNSELING:    Sexuality: Discussed sexually transmitted diseases, partner selection, use of condoms, avoidance of unintended pregnancy  and contraceptive alternatives.   Advised to avoid cigarette smoking.  I discussed with the patient that most people either abstain from alcohol or drink within safe limits (<=14/week and <=4 drinks/occasion for males, <=7/weeks and <= 3 drinks/occasion for females) and that the risk for alcohol disorders and other health effects rises proportionally with the number of drinks per week and how often a drinker exceeds daily limits.  Discussed cessation/primary prevention of drug use and availability of treatment for abuse.   Diet: Encouraged to adjust caloric intake to maintain  or achieve ideal body weight,  to reduce intake of dietary saturated fat and total fat, to limit sodium intake by avoiding high sodium foods and not adding table salt, and to maintain adequate dietary potassium and calcium preferably from fresh fruits, vegetables, and low-fat dairy products.    stressed the importance of regular exercise  Injury prevention: Discussed safety belts, safety helmets, smoke detector, smoking near bedding or upholstery.   Dental health: Discussed importance of regular tooth brushing, flossing, and dental visits.   Follow up plan: NEXT PREVENTATIVE PHYSICAL DUE IN 1 YEAR. Return in about 1 year (around 01/18/2021) for Physical.

## 2020-01-26 LAB — CBC WITH DIFFERENTIAL/PLATELET
Basophils Absolute: 0 10*3/uL (ref 0.0–0.2)
Basos: 1 %
EOS (ABSOLUTE): 0.1 10*3/uL (ref 0.0–0.4)
Eos: 1 %
Hematocrit: 41.5 % (ref 37.5–51.0)
Hemoglobin: 14.4 g/dL (ref 13.0–17.7)
Immature Grans (Abs): 0 10*3/uL (ref 0.0–0.1)
Immature Granulocytes: 0 %
Lymphocytes Absolute: 1.5 10*3/uL (ref 0.7–3.1)
Lymphs: 36 %
MCH: 32.2 pg (ref 26.6–33.0)
MCHC: 34.7 g/dL (ref 31.5–35.7)
MCV: 93 fL (ref 79–97)
Monocytes Absolute: 0.5 10*3/uL (ref 0.1–0.9)
Monocytes: 11 %
Neutrophils Absolute: 2.2 10*3/uL (ref 1.4–7.0)
Neutrophils: 51 %
Platelets: 265 10*3/uL (ref 150–450)
RBC: 4.47 x10E6/uL (ref 4.14–5.80)
RDW: 12.6 % (ref 11.6–15.4)
WBC: 4.3 10*3/uL (ref 3.4–10.8)

## 2020-01-26 LAB — COMPREHENSIVE METABOLIC PANEL
ALT: 13 IU/L (ref 0–44)
AST: 18 IU/L (ref 0–40)
Albumin/Globulin Ratio: 2.1 (ref 1.2–2.2)
Albumin: 4.5 g/dL (ref 3.8–4.8)
Alkaline Phosphatase: 47 IU/L (ref 39–117)
BUN/Creatinine Ratio: 13 (ref 10–24)
BUN: 12 mg/dL (ref 8–27)
Bilirubin Total: 0.5 mg/dL (ref 0.0–1.2)
CO2: 27 mmol/L (ref 20–29)
Calcium: 9.3 mg/dL (ref 8.6–10.2)
Chloride: 103 mmol/L (ref 96–106)
Creatinine, Ser: 0.89 mg/dL (ref 0.76–1.27)
GFR calc Af Amer: 107 mL/min/{1.73_m2} (ref >59)
GFR calc non Af Amer: 92 mL/min/{1.73_m2} (ref >59)
Globulin, Total: 2.1 g/dL (ref 1.5–4.5)
Glucose: 94 mg/dL (ref 65–99)
Potassium: 4.4 mmol/L (ref 3.5–5.2)
Sodium: 142 mmol/L (ref 134–144)
Total Protein: 6.6 g/dL (ref 6.0–8.5)

## 2020-01-26 LAB — LIPID PANEL W/O CHOL/HDL RATIO
Cholesterol, Total: 177 mg/dL (ref 100–199)
HDL: 63 mg/dL (ref >39)
LDL Chol Calc (NIH): 104 mg/dL — ABNORMAL HIGH (ref 0–99)
Triglycerides: 49 mg/dL (ref 0–149)
VLDL Cholesterol Cal: 10 mg/dL (ref 5–40)

## 2020-01-26 LAB — NICOTINE/COTININE METABOLITES
Cotinine: 599.7 ng/mL
Nicotine: 16.9 ng/mL

## 2020-01-26 LAB — TSH: TSH: 0.943 u[IU]/mL (ref 0.450–4.500)

## 2020-01-26 LAB — PSA: Prostate Specific Ag, Serum: 0.4 ng/mL (ref 0.0–4.0)

## 2020-01-26 LAB — HGB A1C W/O EAG: Hgb A1c MFr Bld: 5.6 % (ref 4.8–5.6)

## 2020-02-06 ENCOUNTER — Telehealth: Payer: Self-pay

## 2020-02-06 NOTE — Telephone Encounter (Signed)
Called patient to let him know his paperwork is ready for pick up. Patient stated he will come by sometime today to it pick up. Paperwork placed in the pick up box.   Copied from CRM [id number]. Topic: General - Other >> Feb 06, 2020  9:55 AM Marylen Ponto wrote: Reason for CRM: Pt stated during his last visit he left some forms to be completed and he would like to know if they are ready. Pt requests call back

## 2020-07-19 LAB — HM COLONOSCOPY

## 2021-01-20 ENCOUNTER — Ambulatory Visit (INDEPENDENT_AMBULATORY_CARE_PROVIDER_SITE_OTHER): Payer: 59 | Admitting: Family Medicine

## 2021-01-20 ENCOUNTER — Other Ambulatory Visit: Payer: Self-pay

## 2021-01-20 ENCOUNTER — Encounter: Payer: Self-pay | Admitting: Family Medicine

## 2021-01-20 VITALS — BP 130/88 | HR 56 | Temp 98.9°F | Ht 68.75 in | Wt 186.8 lb

## 2021-01-20 DIAGNOSIS — Z0000 Encounter for general adult medical examination without abnormal findings: Secondary | ICD-10-CM | POA: Diagnosis not present

## 2021-01-20 LAB — URINALYSIS, ROUTINE W REFLEX MICROSCOPIC
Bilirubin, UA: NEGATIVE
Glucose, UA: NEGATIVE
Ketones, UA: NEGATIVE
Leukocytes,UA: NEGATIVE
Nitrite, UA: NEGATIVE
Protein,UA: NEGATIVE
RBC, UA: NEGATIVE
Specific Gravity, UA: 1.02 (ref 1.005–1.030)
Urobilinogen, Ur: 0.2 mg/dL (ref 0.2–1.0)
pH, UA: 7 (ref 5.0–7.5)

## 2021-01-20 NOTE — Progress Notes (Signed)
BP 130/88   Pulse (!) 56   Temp 98.9 F (37.2 C)   Ht 5' 8.75" (1.746 m)   Wt 186 lb 12.8 oz (84.7 kg)   SpO2 96%   BMI 27.79 kg/m    Subjective:    Patient ID: Thomas Whitney, male    DOB: [birth year], 60 y.o.   MRN: [id number]  HPI: Thomas Whitney is a 60 y.o. male presenting on 01/20/2021 for comprehensive medical examination. Current medical complaints include:none  Interim Problems from his last visit: yes  Depression Screen done today and results listed below:  Depression screen [facility] 2/9 01/20/2021 01/19/2020 01/17/2019 01/11/2018 01/06/2017  Decreased Interest 0 0 0 0 0  Down, Depressed, Hopeless 0 0 0 0 0  PHQ - 2 Score 0 0 0 0 0  Altered sleeping - 0 0 0 -  Tired, decreased energy - 0 0 0 -  Change in appetite - 0 0 0 -  Feeling bad or failure about yourself  - 0 0 0 -  Trouble concentrating - 0 0 0 -  Moving slowly or fidgety/restless - 0 0 0 -  Suicidal thoughts - 0 0 0 -  PHQ-9 Score - 0 0 0 -  Difficult doing work/chores - Not difficult at all Not difficult at all - -    Past Medical History:  Past Medical History:  Diagnosis Date  . GERD (gastroesophageal reflux disease)   . Headache     Surgical History:  Past Surgical History:  Procedure Laterality Date  . COLONOSCOPY  2013  . CYSTOSCOPY WITH STENT PLACEMENT Left 05/18/2017   Procedure: CYSTOSCOPY WITH STENT PLACEMENT;  Surgeon: Orson Ape, MD;  Location: [facility];  Service: Urology;  Laterality: Left;  . EXTRACORPOREAL SHOCK WAVE LITHOTRIPSY Left 05/13/2017   Procedure: EXTRACORPOREAL SHOCK WAVE LITHOTRIPSY (ESWL);  Surgeon: Orson Ape, MD;  Location: [facility];  Service: Urology;  Laterality: Left;  . HERNIA REPAIR Right 11/03/2010   Direct inguinal hernia, large Ultra Pro mesh.  . INGUINAL HERNIA REPAIR Left 02/10/2016   Procedure: HERNIA REPAIR INGUINAL ADULT;  Surgeon: Earline Mayotte, MD;  Location: [facility];  Service: General;  Laterality: Left;  . INGUINAL HERNIA REPAIR Left  02/10/2016  . KIDNEY STONE SURGERY     2001, 2006  . TONSILLECTOMY  1966  . VASECTOMY  1992    Medications:  Current Outpatient Medications on File Prior to Visit  Medication Sig  . aspirin-acetaminophen-caffeine (EXCEDRIN MIGRAINE) 250-250-65 MG tablet Take 2 tablets by mouth every 8 (eight) hours as needed for headache.  . ibuprofen (ADVIL,MOTRIN) 200 MG tablet Take 400 mg by mouth every 8 (eight) hours as needed (for pain.).   Marland Kitchen loratadine (CLARITIN) 10 MG tablet Take 10 mg by mouth daily as needed (for allergies.).   Marland Kitchen omeprazole (PRILOSEC) 20 MG capsule Take 20 mg by mouth daily as needed (for acid reflux/indigestion.).   Marland Kitchen Soft Lens Products (REWETTING DROPS) SOLN Place 1 drop into both eyes daily as needed (for contact lenses.).   No current facility-administered medications on file prior to visit.    Allergies:  No Known Allergies  Social History:  Social History   Socioeconomic History  . Marital status: Married    Spouse name: Not on file  . Number of children: Not on file  . Years of education: Not on file  . Highest education level: Not on file  Occupational History  . Not on file  Tobacco Use  .  Smoking status: Former Smoker    Years: 20.00    Types: Cigarettes    Quit date: 01/07/2016    Years since quitting: 5.0  . Smokeless tobacco: Former NeurosurgeonUser    Types: Snuff  Vaping Use  . Vaping Use: Never used  Substance and Sexual Activity  . Alcohol use: Yes    Alcohol/week: 0.0 standard drinks    Comment: on occasion  . Drug use: No  . Sexual activity: Yes  Other Topics Concern  . Not on file  Social History Narrative  . Not on file   Social Determinants of Health   Financial Resource Strain: Not on file  Food Insecurity: Not on file  Transportation Needs: Not on file  Physical Activity: Not on file  Stress: Not on file  Social Connections: Not on file  Intimate Partner Violence: Not on file   Social History   Tobacco Use  Smoking Status Former  Smoker  . Years: 20.00  . Types: Cigarettes  . Quit date: 01/07/2016  . Years since quitting: 5.0  Smokeless Tobacco Former NeurosurgeonUser  . Types: Snuff   Social History   Substance and Sexual Activity  Alcohol Use Yes  . Alcohol/week: 0.0 standard drinks   Comment: on occasion    Family History:  Family History  Problem Relation Age of Onset  . Dementia Mother   . Hypertension Mother   . Diabetes Father   . Diabetes Maternal Grandfather   . Diabetes Paternal Grandfather     Past medical history, surgical history, medications, allergies, family history and social history reviewed with patient today and changes made to appropriate areas of the chart.   Review of Systems  Constitutional: Negative.   HENT: Negative.   Eyes: Negative.   Respiratory: Negative.   Cardiovascular: Negative.   Gastrointestinal: Negative.   Genitourinary: Negative.   Musculoskeletal: Negative.   Skin: Negative.   Neurological: Negative.   Endo/Heme/Allergies: Negative.   Psychiatric/Behavioral: Negative.    All other ROS negative except what is listed above and in the HPI.      Objective:    BP 130/88   Pulse (!) 56   Temp 98.9 F (37.2 C)   Ht 5' 8.75" (1.746 m)   Wt 186 lb 12.8 oz (84.7 kg)   SpO2 96%   BMI 27.79 kg/m   Wt Readings from Last 3 Encounters:  01/20/21 186 lb 12.8 oz (84.7 kg)  01/19/20 175 lb (79.4 kg)  01/17/19 191 lb (86.6 kg)    Physical Exam Vitals and nursing note reviewed.  Constitutional:      General: He is not in acute distress.    Appearance: Normal appearance. He is obese. He is not ill-appearing, toxic-appearing or diaphoretic.  HENT:     Head: Normocephalic and atraumatic.     Right Ear: Tympanic membrane, ear canal and external ear normal. There is no impacted cerumen.     Left Ear: Tympanic membrane, ear canal and external ear normal. There is no impacted cerumen.     Nose: Nose normal. No congestion or rhinorrhea.     Mouth/Throat:     Mouth: Mucous  membranes are moist.     Pharynx: Oropharynx is clear. No oropharyngeal exudate or posterior oropharyngeal erythema.  Eyes:     General: No scleral icterus.       Right eye: No discharge.        Left eye: No discharge.     Extraocular Movements: Extraocular movements intact.  Conjunctiva/sclera: Conjunctivae normal.     Pupils: Pupils are equal, round, and reactive to light.  Neck:     Vascular: No carotid bruit.  Cardiovascular:     Rate and Rhythm: Normal rate and regular rhythm.     Pulses: Normal pulses.     Heart sounds: No murmur heard. No friction rub. No gallop.   Pulmonary:     Effort: Pulmonary effort is normal. No respiratory distress.     Breath sounds: Normal breath sounds. No stridor. No wheezing, rhonchi or rales.  Chest:     Chest wall: No tenderness.  Abdominal:     General: Abdomen is flat. Bowel sounds are normal. There is no distension.     Palpations: Abdomen is soft. There is no mass.     Tenderness: There is no abdominal tenderness. There is no right CVA tenderness, left CVA tenderness, guarding or rebound.     Hernia: No hernia is present.  Genitourinary:    Comments: Genital exam deferred with shared decision making Musculoskeletal:        General: No swelling, tenderness, deformity or signs of injury.     Cervical back: Normal range of motion and neck supple. No rigidity. No muscular tenderness.     Right lower leg: No edema.     Left lower leg: No edema.  Lymphadenopathy:     Cervical: No cervical adenopathy.  Skin:    General: Skin is warm and dry.     Capillary Refill: Capillary refill takes less than 2 seconds.     Coloration: Skin is not jaundiced or pale.     Findings: No bruising, erythema, lesion or rash.  Neurological:     General: No focal deficit present.     Mental Status: He is alert and oriented to person, place, and time.     Cranial Nerves: No cranial nerve deficit.     Sensory: No sensory deficit.     Motor: No weakness.      Coordination: Coordination normal.     Gait: Gait normal.     Deep Tendon Reflexes: Reflexes normal.  Psychiatric:        Mood and Affect: Mood normal.        Behavior: Behavior normal.        Thought Content: Thought content normal.        Judgment: Judgment normal.     Results for orders placed or performed in visit on 01/20/21  HM COLONOSCOPY  Result Value Ref Range   HM Colonoscopy See Report (in chart) See Report (in chart), Patient Reported      Assessment & Plan:   Problem List Items Addressed This Visit   None   Visit Diagnoses    Routine general medical examination at a health care facility    -  Primary   Vaccines up to date. Screening labs checked today. Colonoscopy up to date. Continue diet and exercise. Call with any concerns. Continue to monitor.    Relevant Orders   Comprehensive metabolic panel   CBC with Differential/Platelet   Lipid Panel w/o Chol/HDL Ratio   PSA   TSH   Urinalysis, Routine w reflex microscopic       Discussed aspirin prophylaxis for myocardial infarction prevention and decision was it was not indicated  LABORATORY TESTING:  Health maintenance labs ordered today as discussed above.   The natural history of prostate cancer and ongoing controversy regarding screening and potential treatment outcomes of prostate cancer has been discussed with  the patient. The meaning of a false positive PSA and a false negative PSA has been discussed. He indicates understanding of the limitations of this screening test and wishes to proceed with screening PSA testing.   IMMUNIZATIONS:   - Tdap: Tetanus vaccination status reviewed: last tetanus booster within 10 years. - Influenza: Up to date - Pneumovax: Not applicable - COVID: Up to date  SCREENING: - Colonoscopy: Up to date  Discussed with patient purpose of the colonoscopy is to detect colon cancer at curable precancerous or early stages   PATIENT COUNSELING:    Sexuality: Discussed sexually  transmitted diseases, partner selection, use of condoms, avoidance of unintended pregnancy  and contraceptive alternatives.   Advised to avoid cigarette smoking.  I discussed with the patient that most people either abstain from alcohol or drink within safe limits (<=14/week and <=4 drinks/occasion for males, <=7/weeks and <= 3 drinks/occasion for females) and that the risk for alcohol disorders and other health effects rises proportionally with the number of drinks per week and how often a drinker exceeds daily limits.  Discussed cessation/primary prevention of drug use and availability of treatment for abuse.   Diet: Encouraged to adjust caloric intake to maintain  or achieve ideal body weight, to reduce intake of dietary saturated fat and total fat, to limit sodium intake by avoiding high sodium foods and not adding table salt, and to maintain adequate dietary potassium and calcium preferably from fresh fruits, vegetables, and low-fat dairy products.    stressed the importance of regular exercise  Injury prevention: Discussed safety belts, safety helmets, smoke detector, smoking near bedding or upholstery.   Dental health: Discussed importance of regular tooth brushing, flossing, and dental visits.   Follow up plan: NEXT PREVENTATIVE PHYSICAL DUE IN 1 YEAR. Return in about 1 year (around 01/20/2022) for physical.

## 2021-01-20 NOTE — Patient Instructions (Signed)

## 2021-01-21 LAB — COMPREHENSIVE METABOLIC PANEL
ALT: 20 IU/L (ref 0–44)
AST: 24 IU/L (ref 0–40)
Albumin/Globulin Ratio: 1.9 (ref 1.2–2.2)
Albumin: 4.5 g/dL (ref 3.8–4.8)
Alkaline Phosphatase: 52 IU/L (ref 44–121)
BUN/Creatinine Ratio: 13 (ref 10–24)
BUN: 13 mg/dL (ref 8–27)
Bilirubin Total: 0.6 mg/dL (ref 0.0–1.2)
CO2: 25 mmol/L (ref 20–29)
Calcium: 9.3 mg/dL (ref 8.6–10.2)
Chloride: 101 mmol/L (ref 96–106)
Creatinine, Ser: 1 mg/dL (ref 0.76–1.27)
Globulin, Total: 2.4 g/dL (ref 1.5–4.5)
Glucose: 94 mg/dL (ref 65–99)
Potassium: 4.3 mmol/L (ref 3.5–5.2)
Sodium: 139 mmol/L (ref 134–144)
Total Protein: 6.9 g/dL (ref 6.0–8.5)
eGFR: 85 mL/min/{1.73_m2} (ref >59)

## 2021-01-21 LAB — CBC WITH DIFFERENTIAL/PLATELET
Basophils Absolute: 0 10*3/uL (ref 0.0–0.2)
Basos: 1 %
EOS (ABSOLUTE): 0.1 10*3/uL (ref 0.0–0.4)
Eos: 2 %
Hematocrit: 43.5 % (ref 37.5–51.0)
Hemoglobin: 14.7 g/dL (ref 13.0–17.7)
Immature Grans (Abs): 0 10*3/uL (ref 0.0–0.1)
Immature Granulocytes: 0 %
Lymphocytes Absolute: 1.9 10*3/uL (ref 0.7–3.1)
Lymphs: 35 %
MCH: 31.5 pg (ref 26.6–33.0)
MCHC: 33.8 g/dL (ref 31.5–35.7)
MCV: 93 fL (ref 79–97)
Monocytes Absolute: 0.6 10*3/uL (ref 0.1–0.9)
Monocytes: 11 %
Neutrophils Absolute: 2.8 10*3/uL (ref 1.4–7.0)
Neutrophils: 51 %
Platelets: 272 10*3/uL (ref 150–450)
RBC: 4.66 x10E6/uL (ref 4.14–5.80)
RDW: 12.7 % (ref 11.6–15.4)
WBC: 5.4 10*3/uL (ref 3.4–10.8)

## 2021-01-21 LAB — LIPID PANEL W/O CHOL/HDL RATIO
Cholesterol, Total: 207 mg/dL — ABNORMAL HIGH (ref 100–199)
HDL: 69 mg/dL (ref >39)
LDL Chol Calc (NIH): 126 mg/dL — ABNORMAL HIGH (ref 0–99)
Triglycerides: 66 mg/dL (ref 0–149)
VLDL Cholesterol Cal: 12 mg/dL (ref 5–40)

## 2021-01-21 LAB — TSH: TSH: 1.87 u[IU]/mL (ref 0.450–4.500)

## 2021-01-21 LAB — PSA: Prostate Specific Ag, Serum: 0.5 ng/mL (ref 0.0–4.0)

## 2021-03-10 ENCOUNTER — Encounter: Payer: Self-pay | Admitting: Family Medicine

## 2021-03-10 ENCOUNTER — Ambulatory Visit: Payer: 59 | Admitting: Family Medicine

## 2021-03-10 ENCOUNTER — Ambulatory Visit
Admission: RE | Admit: 2021-03-10 | Discharge: 2021-03-10 | Disposition: A | Discharge status: Home / Self Care | Payer: 59 | Source: Ambulatory Visit | Attending: Family Medicine | Admitting: Family Medicine

## 2021-03-10 ENCOUNTER — Ambulatory Visit
Admission: RE | Admit: 2021-03-10 | Discharge: 2021-03-10 | Disposition: A | Discharge status: Home / Self Care | Payer: 59 | Source: Home / Self Care | Attending: Family Medicine | Admitting: Family Medicine

## 2021-03-10 ENCOUNTER — Other Ambulatory Visit: Payer: Self-pay

## 2021-03-10 VITALS — BP 119/79 | HR 70 | Temp 98.2°F | Wt 186.0 lb

## 2021-03-10 DIAGNOSIS — G8929 Other chronic pain: Secondary | ICD-10-CM | POA: Diagnosis present

## 2021-03-10 DIAGNOSIS — M545 Low back pain, unspecified: Secondary | ICD-10-CM | POA: Diagnosis present

## 2021-03-10 DIAGNOSIS — M546 Pain in thoracic spine: Secondary | ICD-10-CM | POA: Diagnosis present

## 2021-03-10 NOTE — Progress Notes (Signed)
BP 119/79   Pulse 70   Temp 98.2 F (36.8 C)   Wt 186 lb (84.4 kg)   SpO2 97%   BMI 27.67 kg/m    Subjective:    Patient ID: Thomas Whitney, male    DOB: February 28, 1961, 60 y.o.   MRN: [id number]  HPI: Thomas Whitney is a 60 y.o. male  Chief Complaint  Patient presents with  . back concern    Patient states he was sent by his chiropractor regarding x-rays that were taken.     Has chronic back pain. He went to see his chiropractor, who did x-rays about 2 months ago. His chiropractor was concerned that he may have something going on in front of his spine, but he did not elucidate what he was concerned about. Outside printed imaged of x-rays reviewed today, some streaking in the lungs only on lateral view and mild spurring at the lumbar endplates, but otherwise nothing per my reading that looks concerning. He notes that he has had no abdominal pain, no issues with his urine. No nausea, no vomiting, no constipation. He notes that his back pain is chronic and maintained by chiropractry. Pain does not radiate. He is otherwise feeling well with no other concerns or complaints at this time.   Relevant past medical, surgical, family and social history reviewed and updated as indicated. Interim medical history since our last visit reviewed. Allergies and medications reviewed and updated.  Review of Systems  Constitutional: Negative.   Respiratory: Negative.   Cardiovascular: Negative.   Gastrointestinal: Negative.   Musculoskeletal: Positive for back pain and myalgias. Negative for arthralgias, gait problem, joint swelling, neck pain and neck stiffness.  Skin: Negative.   Neurological: Negative.   Psychiatric/Behavioral: Negative.     Per HPI unless specifically indicated above     Objective:    BP 119/79   Pulse 70   Temp 98.2 F (36.8 C)   Wt 186 lb (84.4 kg)   SpO2 97%   BMI 27.67 kg/m   Wt Readings from Last 3 Encounters:  03/10/21 186 lb (84.4 kg)  01/20/21 186 lb 12.8  oz (84.7 kg)  01/19/20 175 lb (79.4 kg)    Physical Exam Vitals and nursing note reviewed.  Constitutional:      General: He is not in acute distress.    Appearance: Normal appearance. He is not ill-appearing, toxic-appearing or diaphoretic.  HENT:     Head: Normocephalic and atraumatic.     Right Ear: External ear normal.     Left Ear: External ear normal.     Nose: Nose normal.     Mouth/Throat:     Mouth: Mucous membranes are moist.     Pharynx: Oropharynx is clear.  Eyes:     General: No scleral icterus.       Right eye: No discharge.        Left eye: No discharge.     Extraocular Movements: Extraocular movements intact.     Conjunctiva/sclera: Conjunctivae normal.     Pupils: Pupils are equal, round, and reactive to light.  Cardiovascular:     Rate and Rhythm: Normal rate and regular rhythm.     Pulses: Normal pulses.     Heart sounds: Normal heart sounds. No murmur heard. No friction rub. No gallop.   Pulmonary:     Effort: Pulmonary effort is normal. No respiratory distress.     Breath sounds: Normal breath sounds. No stridor. No wheezing, rhonchi or rales.  Chest:     Chest wall: No tenderness.  Musculoskeletal:        General: Normal range of motion.     Cervical back: Normal range of motion and neck supple.  Skin:    General: Skin is warm and dry.     Capillary Refill: Capillary refill takes less than 2 seconds.     Coloration: Skin is not jaundiced or pale.     Findings: No bruising, erythema, lesion or rash.  Neurological:     General: No focal deficit present.     Mental Status: He is alert and oriented to person, place, and time. Mental status is at baseline.  Psychiatric:        Mood and Affect: Mood normal.        Behavior: Behavior normal.        Thought Content: Thought content normal.        Judgment: Judgment normal.     Results for orders placed or performed in visit on 01/20/21  Comprehensive metabolic panel  Result Value Ref Range    Glucose 94 65 - 99 mg/dL   BUN 13 8 - 27 mg/dL   Creatinine, Ser 1.00 0.76 - 1.27 mg/dL   eGFR 85 >59 mL/min/1.73   BUN/Creatinine Ratio 13 10 - 24   Sodium 139 134 - 144 mmol/L   Potassium 4.3 3.5 - 5.2 mmol/L   Chloride 101 96 - 106 mmol/L   CO2 25 20 - 29 mmol/L   Calcium 9.3 8.6 - 10.2 mg/dL   Total Protein 6.9 6.0 - 8.5 g/dL   Albumin 4.5 3.8 - 4.8 g/dL   Globulin, Total 2.4 1.5 - 4.5 g/dL   Albumin/Globulin Ratio 1.9 1.2 - 2.2   Bilirubin Total 0.6 0.0 - 1.2 mg/dL   Alkaline Phosphatase 52 44 - 121 IU/L   AST 24 0 - 40 IU/L   ALT 20 0 - 44 IU/L  CBC with Differential/Platelet  Result Value Ref Range   WBC 5.4 3.4 - 10.8 x10E3/uL   RBC 4.66 4.14 - 5.80 x10E6/uL   Hemoglobin 14.7 13.0 - 17.7 g/dL   Hematocrit 43.5 37.5 - 51.0 %   MCV 93 79 - 97 fL   MCH 31.5 26.6 - 33.0 pg   MCHC 33.8 31.5 - 35.7 g/dL   RDW 12.7 11.6 - 15.4 %   Platelets 272 150 - 450 x10E3/uL   Neutrophils 51 Not Estab. %   Lymphs 35 Not Estab. %   Monocytes 11 Not Estab. %   Eos 2 Not Estab. %   Basos 1 Not Estab. %   Neutrophils Absolute 2.8 1.4 - 7.0 x10E3/uL   Lymphocytes Absolute 1.9 0.7 - 3.1 x10E3/uL   Monocytes Absolute 0.6 0.1 - 0.9 x10E3/uL   EOS (ABSOLUTE) 0.1 0.0 - 0.4 x10E3/uL   Basophils Absolute 0.0 0.0 - 0.2 x10E3/uL   Immature Granulocytes 0 Not Estab. %   Immature Grans (Abs) 0.0 0.0 - 0.1 x10E3/uL  Lipid Panel w/o Chol/HDL Ratio  Result Value Ref Range   Cholesterol, Total 207 (H) 100 - 199 mg/dL   Triglycerides 66 0 - 149 mg/dL   HDL 69 >39 mg/dL   VLDL Cholesterol Cal 12 5 - 40 mg/dL   LDL Chol Calc (NIH) 126 (H) 0 - 99 mg/dL  PSA  Result Value Ref Range   Prostate Specific Ag, Serum 0.5 0.0 - 4.0 ng/mL  TSH  Result Value Ref Range   TSH 1.870 0.450 - 4.500 uIU/mL  Urinalysis, Routine w reflex microscopic  Result Value Ref Range   Specific Gravity, UA 1.020 1.005 - 1.030   pH, UA 7.0 5.0 - 7.5   Color, UA Yellow Yellow   Appearance Ur Clear Clear   Leukocytes,UA  Negative Negative   Protein,UA Negative Negative/Trace   Glucose, UA Negative Negative   Ketones, UA Negative Negative   RBC, UA Negative Negative   Bilirubin, UA Negative Negative   Urobilinogen, Ur 0.2 0.2 - 1.0 mg/dL   Nitrite, UA Negative Negative  HM COLONOSCOPY  Result Value Ref Range   HM Colonoscopy See Report (in chart) See Report (in chart), Patient Reported      Assessment & Plan:   Problem List Items Addressed This Visit   None   Visit Diagnoses    Chronic thoracic back pain, unspecified back pain laterality    -  Primary   Will check x-rays. Await results. Treat as needed.    Relevant Orders   DG Thoracic Spine W/Swimmers   Chronic low back pain, unspecified back pain laterality, unspecified whether sciatica present       Will check x-rays. Await results. Treat as needed.    Relevant Orders   DG Lumbar Spine Complete       Follow up plan: Return if symptoms worsen or fail to improve.

## 2021-03-14 ENCOUNTER — Telehealth: Payer: Self-pay

## 2021-03-14 NOTE — Telephone Encounter (Signed)
Pt calling to check status. Pt would like a call back today if possible.

## 2021-03-14 NOTE — Telephone Encounter (Signed)
Copied from CRM [id number]. Topic: General - Other >> Mar 13, 2021  4:31 PM Jaquita Rector A wrote: Reason for CRM: Patient called in asking Dr Laural Benes to please give him a call with his X ray results. Per patient Dr Laural Benes said that she would call him on 03/11/21 and he is waiting Can be reached at Ph# [phone]

## 2021-03-14 NOTE — Telephone Encounter (Signed)
See results note. 

## 2021-04-30 ENCOUNTER — Ambulatory Visit: Payer: 59 | Admitting: Dermatology

## 2021-11-29 IMAGING — DX DG LUMBAR SPINE COMPLETE 4+V
6 series · 6 of 6 positions shown · non-contrast
Comparison: None.

CLINICAL DATA: Chronic low back pain.

EXAM:
LUMBAR SPINE - COMPLETE 4+ VIEW

[l-spine ap]
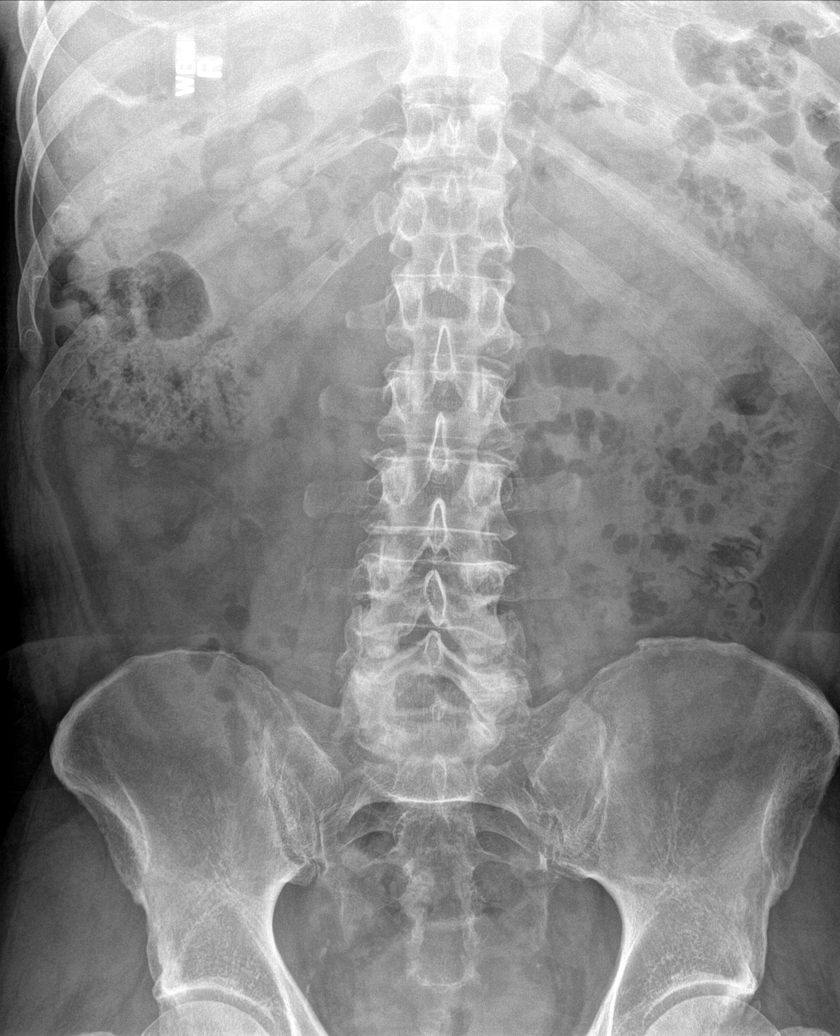

[l-spine obl (1 of 3)]
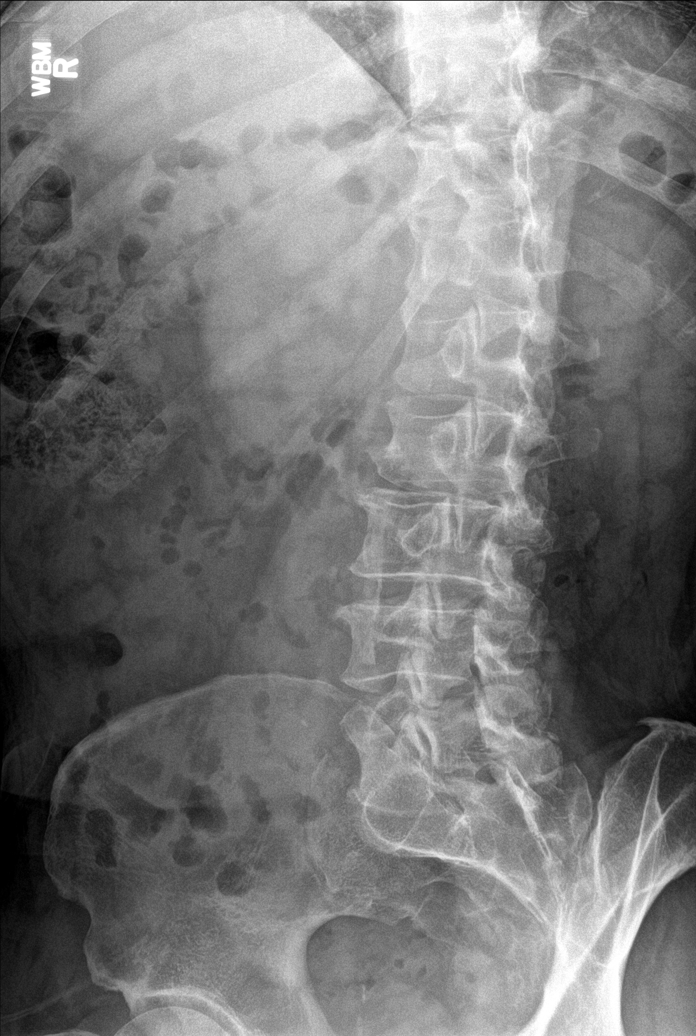

[l-spine obl (2 of 3)]
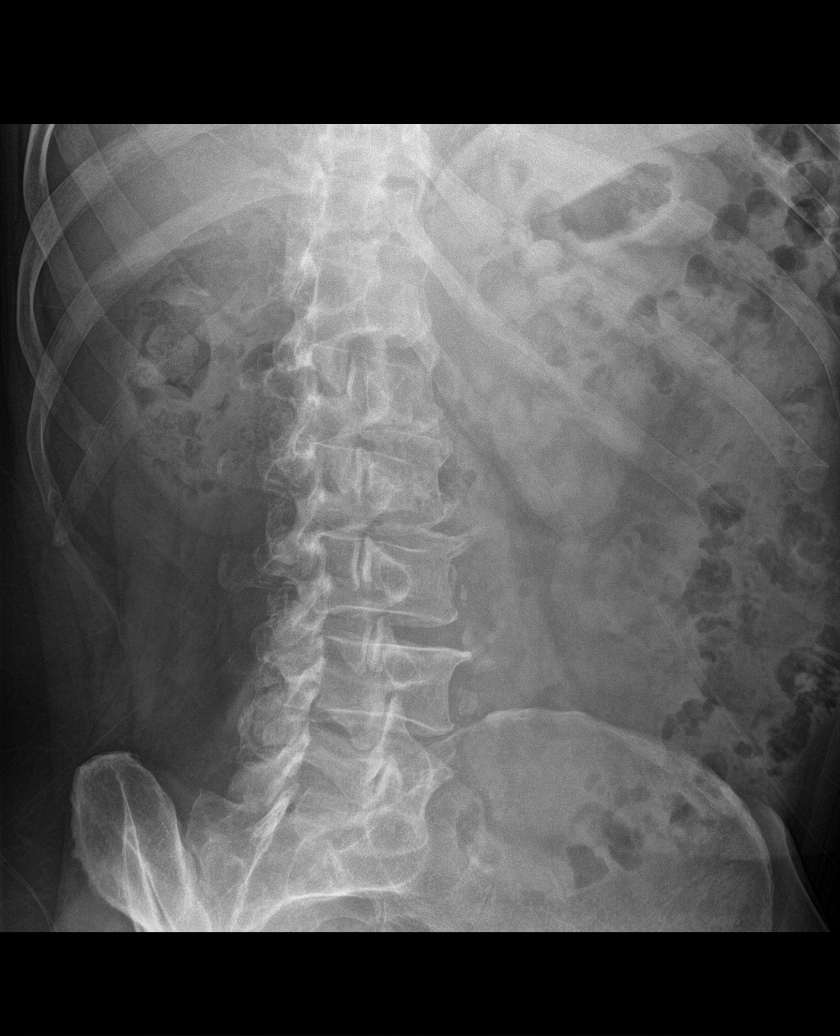

[l-spine lat]
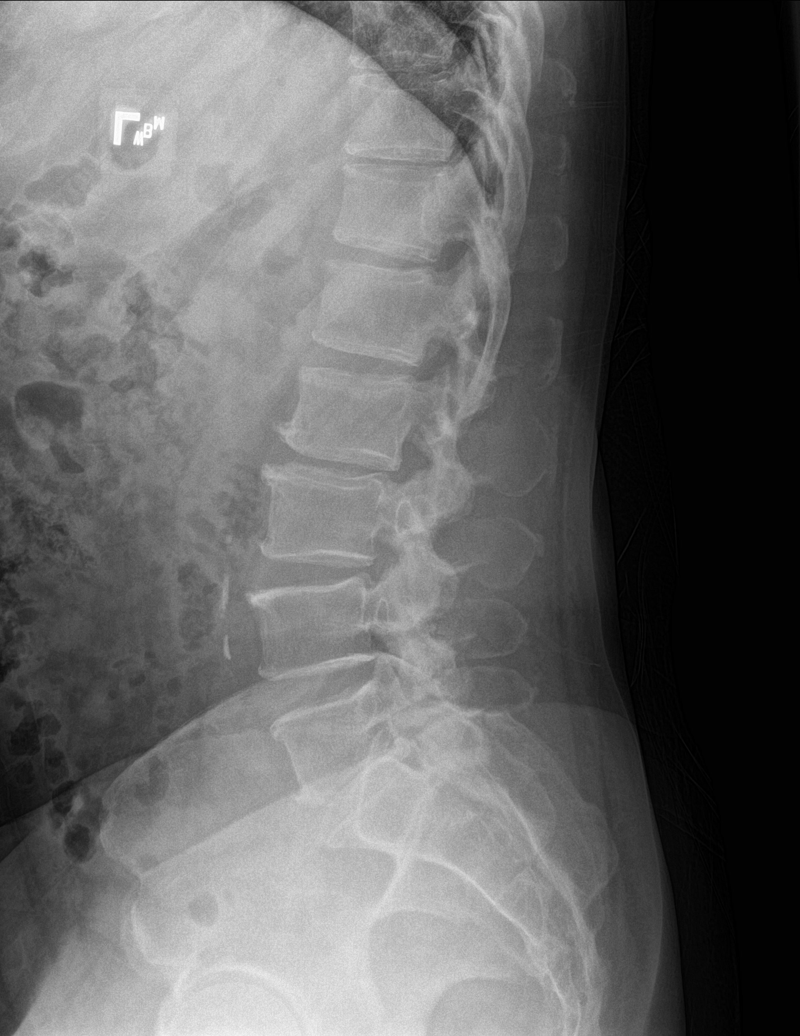

[l-spine spot]
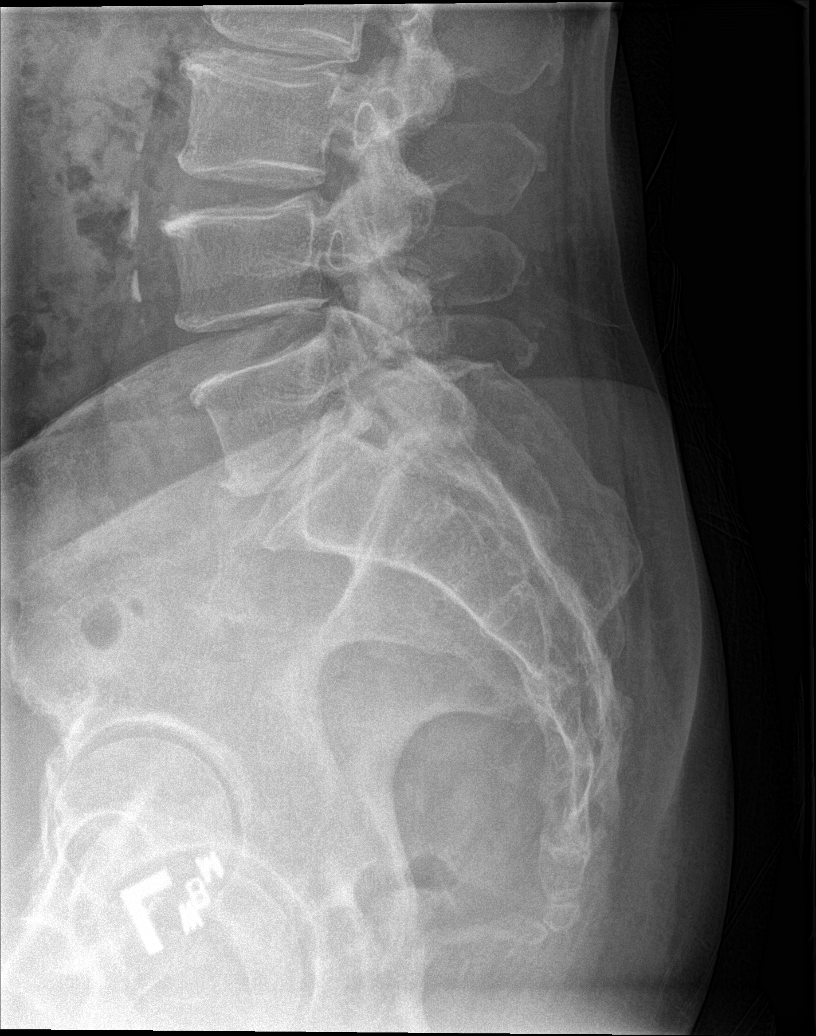

[l-spine obl (3 of 3)]
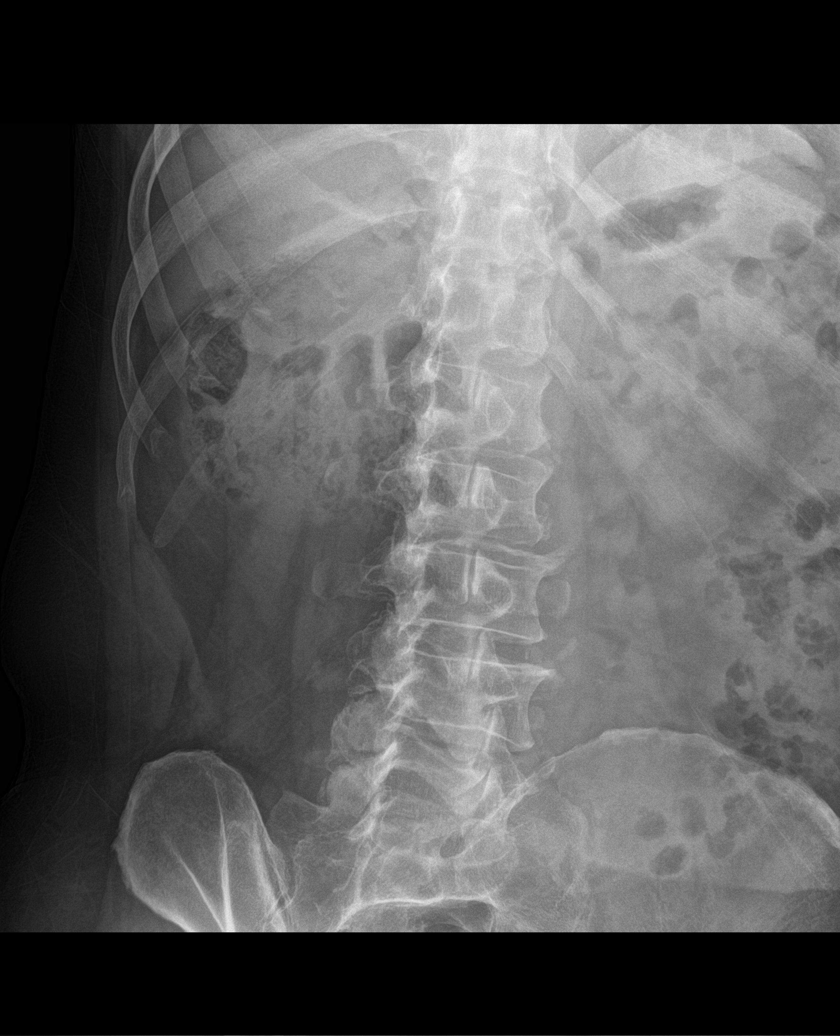

[6 of 6 positions shown; findings below may reference images not displayed]

FINDINGS: Five non rib-bearing lumbar type vertebra are identified in normal
alignment.

There is no evidence of acute fracture or subluxation.

Mild-to-moderate degenerative disc disease/spondylosis at L2-3 and
L5-S1 noted.

Mild facet arthropathy within the LOWER lumbar spine identified.

No focal bony lesions or definite spondylolysis noted.
IMPRESSION: Mild-to-moderate degenerative changes at L2-3 and L5-S1. Mild facet
arthropathy within the LOWER lumbar spine.

## 2021-11-29 IMAGING — DX DG THORACIC SPINE 3V
3 series · 3 of 3 positions shown · non-contrast
Comparison: None.

CLINICAL DATA: Mid back pain.

EXAM:
THORACIC SPINE - 3 VIEWS

[t-spine lat]
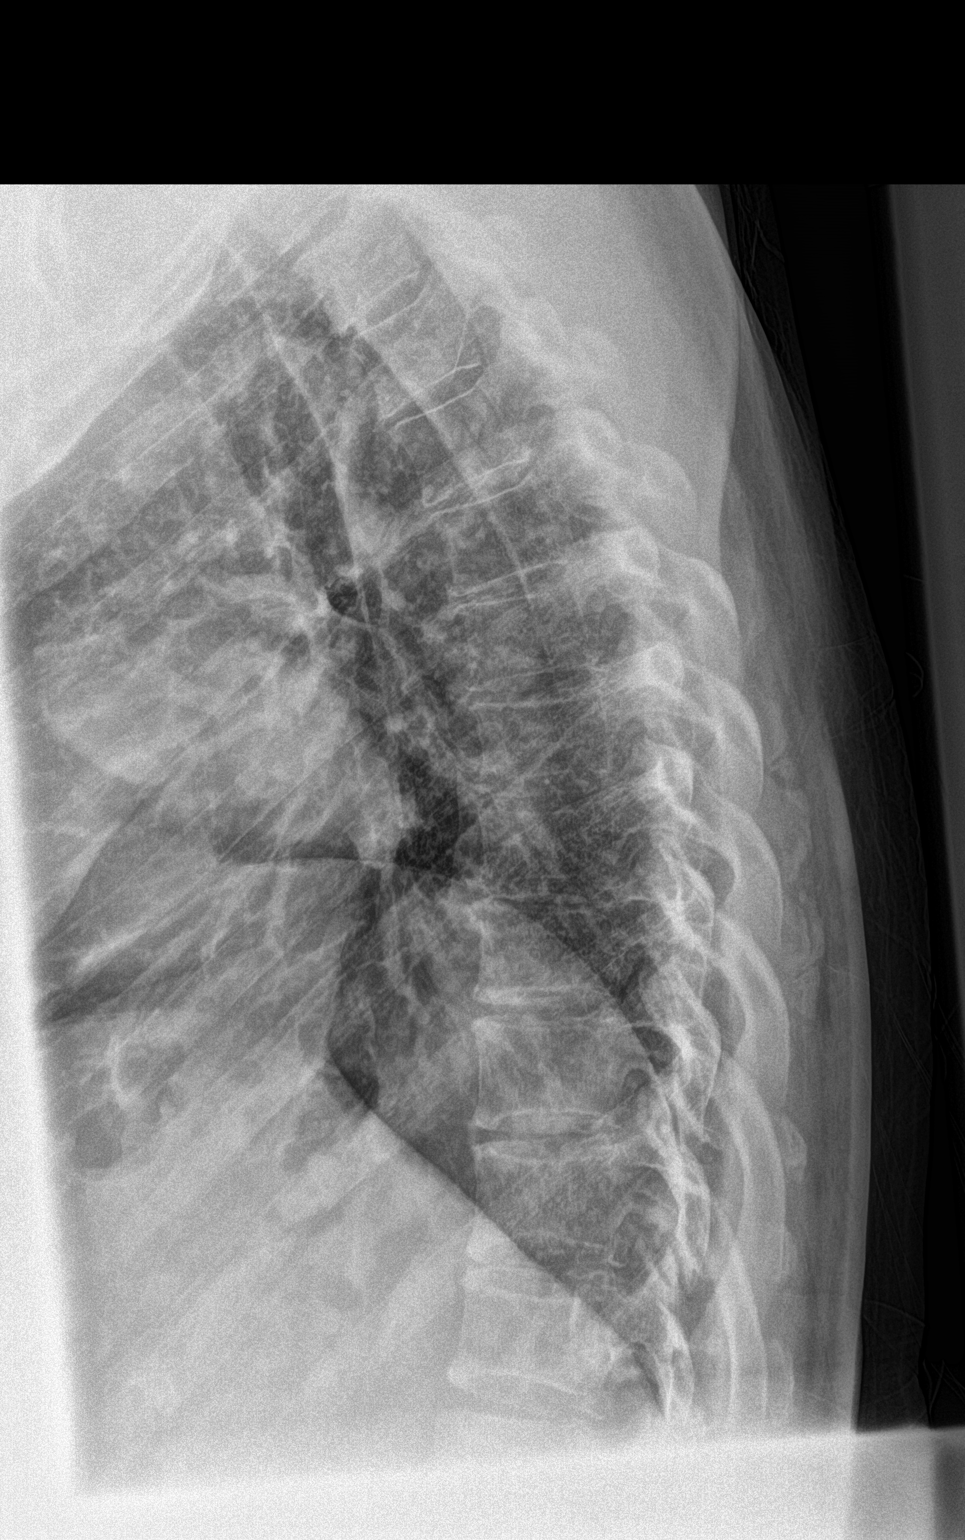

[t-spine swimmers]
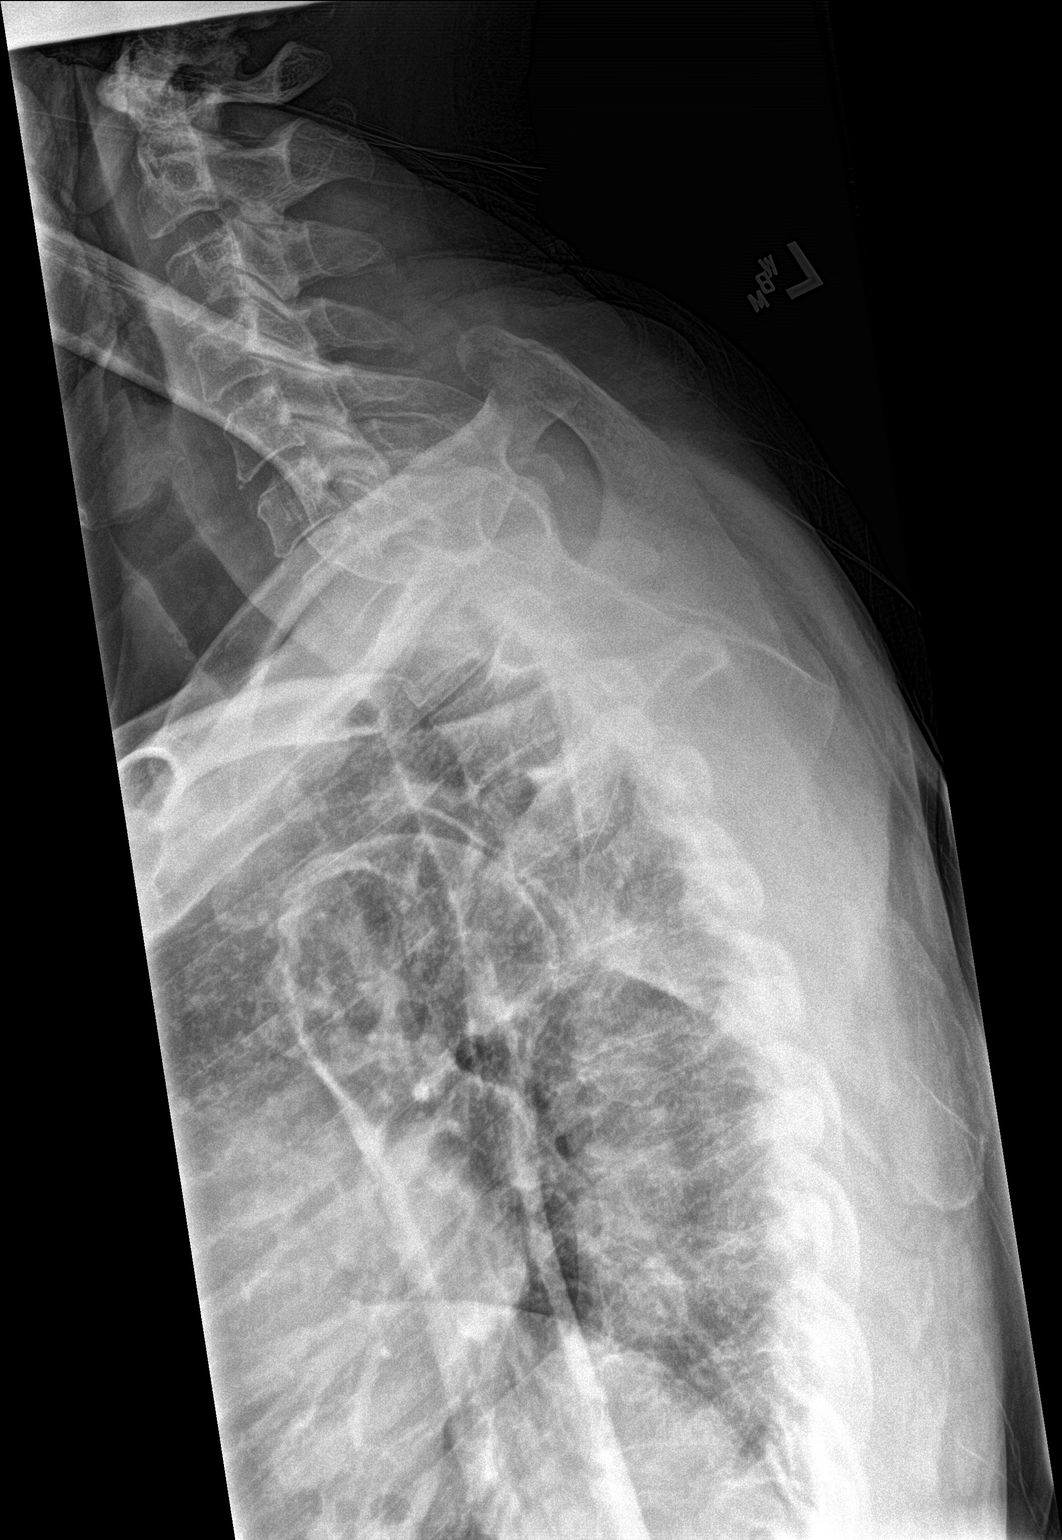

[t-spine ap]
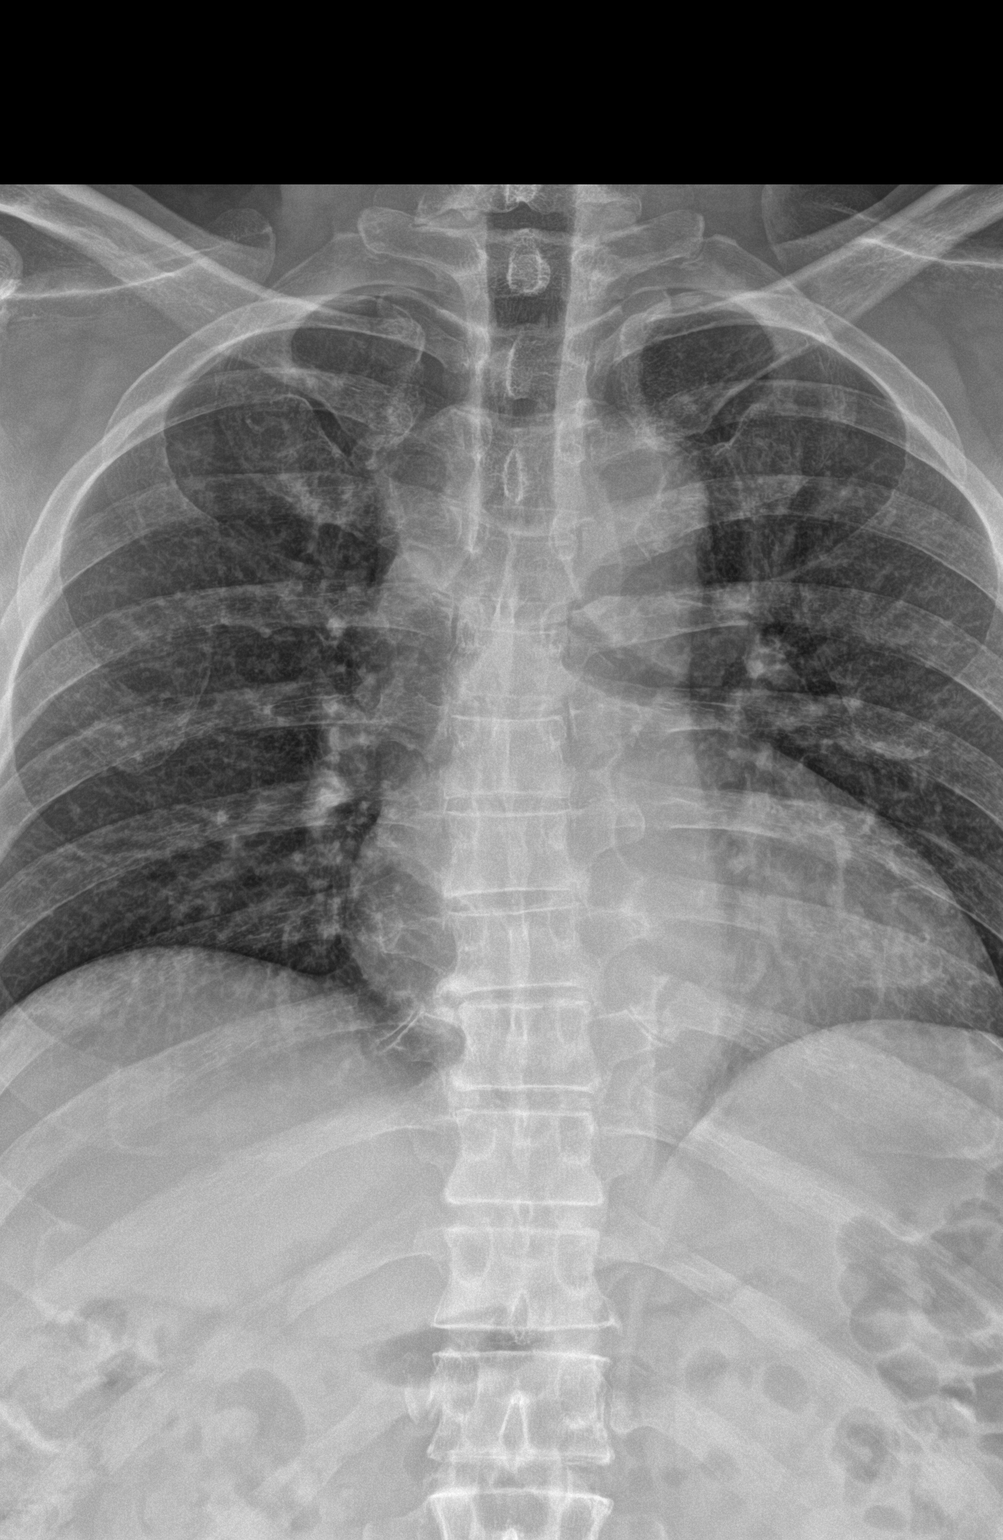

[3 of 3 positions shown; findings below may reference images not displayed]

FINDINGS: There is no evidence of thoracic spine fracture. Alignment is
normal. No other significant bone abnormalities are identified.
IMPRESSION: Negative.

## 2022-01-22 ENCOUNTER — Other Ambulatory Visit: Payer: Self-pay

## 2022-01-22 ENCOUNTER — Encounter: Payer: Self-pay | Admitting: Family Medicine

## 2022-01-22 ENCOUNTER — Ambulatory Visit (INDEPENDENT_AMBULATORY_CARE_PROVIDER_SITE_OTHER): Payer: 59 | Admitting: Family Medicine

## 2022-01-22 VITALS — BP 112/77 | HR 57 | Temp 98.4°F | Wt 178.2 lb

## 2022-01-22 DIAGNOSIS — Z0000 Encounter for general adult medical examination without abnormal findings: Secondary | ICD-10-CM

## 2022-01-22 LAB — URINALYSIS, ROUTINE W REFLEX MICROSCOPIC
Bilirubin, UA: NEGATIVE
Glucose, UA: NEGATIVE
Leukocytes,UA: NEGATIVE
Nitrite, UA: NEGATIVE
Protein,UA: NEGATIVE
RBC, UA: NEGATIVE
Specific Gravity, UA: >1.03 — ABNORMAL HIGH (ref 1.005–1.030)
Urobilinogen, Ur: 0.2 mg/dL (ref 0.2–1.0)
pH, UA: 5.5 (ref 5.0–7.5)

## 2022-01-22 NOTE — Progress Notes (Signed)
BP 112/77    Pulse (!) 57    Temp 98.4 F (36.9 C) (Oral)    Wt 178 lb 3.2 oz (80.8 kg)    SpO2 99%    BMI 26.51 kg/m    Subjective:    Patient ID: Thomas Whitney, male    DOB: Apr 01, 1961, 61 y.o.   MRN: [id number]  HPI: Thomas Whitney is a 61 y.o. male presenting on 01/22/2022 for comprehensive medical examination. Current medical complaints include:none  Interim Problems from his last visit: no  Depression Screen done today and results listed below:  Depression screen [facility] 2/9 01/22/2022 01/20/2021 01/19/2020 01/17/2019 01/11/2018  Decreased Interest 0 0 0 0 0  Down, Depressed, Hopeless 0 0 0 0 0  PHQ - 2 Score 0 0 0 0 0  Altered sleeping 0 - 0 0 0  Tired, decreased energy 0 - 0 0 0  Change in appetite 0 - 0 0 0  Feeling bad or failure about yourself  0 - 0 0 0  Trouble concentrating 0 - 0 0 0  Moving slowly or fidgety/restless 0 - 0 0 0  Suicidal thoughts 0 - 0 0 0  PHQ-9 Score 0 - 0 0 0  Difficult doing work/chores Not difficult at all - Not difficult at all Not difficult at all -     Past Medical History:  Past Medical History:  Diagnosis Date   GERD (gastroesophageal reflux disease)    Headache     Surgical History:  Past Surgical History:  Procedure Laterality Date   COLONOSCOPY  2013   CYSTOSCOPY WITH STENT PLACEMENT Left 05/18/2017   Procedure: CYSTOSCOPY WITH STENT PLACEMENT;  Surgeon: Royston Cowper, MD;  Location: [facility];  Service: Urology;  Laterality: Left;   EXTRACORPOREAL SHOCK WAVE LITHOTRIPSY Left 05/13/2017   Procedure: EXTRACORPOREAL SHOCK WAVE LITHOTRIPSY (ESWL);  Surgeon: Royston Cowper, MD;  Location: [facility];  Service: Urology;  Laterality: Left;   HERNIA REPAIR Right 11/03/2010   Direct inguinal hernia, large Ultra Pro mesh.   INGUINAL HERNIA REPAIR Left 02/10/2016   Procedure: HERNIA REPAIR INGUINAL ADULT;  Surgeon: Robert Bellow, MD;  Location: [facility];  Service: General;  Laterality: Left;   INGUINAL HERNIA REPAIR Left 02/10/2016    KIDNEY STONE SURGERY     2001, 2006   TONSILLECTOMY  1966   VASECTOMY  1992    Medications:  Current Outpatient Medications on File Prior to Visit  Medication Sig   aspirin-acetaminophen-caffeine (EXCEDRIN MIGRAINE) 250-250-65 MG tablet Take 2 tablets by mouth every 8 (eight) hours as needed for headache.   ibuprofen (ADVIL,MOTRIN) 200 MG tablet Take 400 mg by mouth every 8 (eight) hours as needed (for pain.).    loratadine (CLARITIN) 10 MG tablet Take 10 mg by mouth daily as needed (for allergies.).    omeprazole (PRILOSEC) 20 MG capsule Take 20 mg by mouth daily as needed (for acid reflux/indigestion.).    Soft Lens Products (REWETTING DROPS) SOLN Place 1 drop into both eyes daily as needed (for contact lenses.).   No current facility-administered medications on file prior to visit.    Allergies:  No Known Allergies  Social History:  Social History   Socioeconomic History   Marital status: Married    Spouse name: Not on file   Number of children: Not on file   Years of education: Not on file   Highest education level: Not on file  Occupational History   Not on file  Tobacco  Use   Smoking status: Former    Years: 20.00    Types: Cigarettes    Quit date: 01/07/2016    Years since quitting: 6.0   Smokeless tobacco: Former    Types: Snuff  Vaping Use   Vaping Use: Never used  Substance and Sexual Activity   Alcohol use: Yes    Alcohol/week: 0.0 standard drinks    Comment: on occasion   Drug use: No   Sexual activity: Yes  Other Topics Concern   Not on file  Social History Narrative   Not on file   Social Determinants of Health   Financial Resource Strain: Not on file  Food Insecurity: Not on file  Transportation Needs: Not on file  Physical Activity: Not on file  Stress: Not on file  Social Connections: Not on file  Intimate Partner Violence: Not on file   Social History   Tobacco Use  Smoking Status Former   Years: 20.00   Types: Cigarettes   Quit  date: 01/07/2016   Years since quitting: 6.0  Smokeless Tobacco Former   Types: Snuff   Social History   Substance and Sexual Activity  Alcohol Use Yes   Alcohol/week: 0.0 standard drinks   Comment: on occasion    Family History:  Family History  Problem Relation Age of Onset   Dementia Mother    Hypertension Mother    Diabetes Father    Diabetes Maternal Grandfather    Diabetes Paternal Grandfather     Past medical history, surgical history, medications, allergies, family history and social history reviewed with patient today and changes made to appropriate areas of the chart.   Review of Systems  Constitutional: Negative.   HENT: Negative.    Eyes: Negative.   Respiratory: Negative.    Cardiovascular: Negative.   Gastrointestinal: Negative.   Genitourinary: Negative.   Musculoskeletal: Negative.   Skin: Negative.   Neurological: Negative.   Endo/Heme/Allergies: Negative.   Psychiatric/Behavioral: Negative.    All other ROS negative except what is listed above and in the HPI.      Objective:    BP 112/77    Pulse (!) 57    Temp 98.4 F (36.9 C) (Oral)    Wt 178 lb 3.2 oz (80.8 kg)    SpO2 99%    BMI 26.51 kg/m   Wt Readings from Last 3 Encounters:  01/22/22 178 lb 3.2 oz (80.8 kg)  03/10/21 186 lb (84.4 kg)  01/20/21 186 lb 12.8 oz (84.7 kg)    Physical Exam Vitals and nursing note reviewed.  Constitutional:      General: He is not in acute distress.    Appearance: Normal appearance. He is normal weight. He is not ill-appearing, toxic-appearing or diaphoretic.  HENT:     Head: Normocephalic and atraumatic.     Right Ear: Tympanic membrane, ear canal and external ear normal. There is no impacted cerumen.     Left Ear: Tympanic membrane, ear canal and external ear normal. There is no impacted cerumen.     Nose: Nose normal. No congestion or rhinorrhea.     Mouth/Throat:     Mouth: Mucous membranes are moist.     Pharynx: Oropharynx is clear. No  oropharyngeal exudate or posterior oropharyngeal erythema.  Eyes:     General: No scleral icterus.       Right eye: No discharge.        Left eye: No discharge.     Extraocular Movements: Extraocular movements intact.  Conjunctiva/sclera: Conjunctivae normal.     Pupils: Pupils are equal, round, and reactive to light.  Neck:     Vascular: No carotid bruit.  Cardiovascular:     Rate and Rhythm: Normal rate and regular rhythm.     Pulses: Normal pulses.     Heart sounds: No murmur heard.   No friction rub. No gallop.  Pulmonary:     Effort: Pulmonary effort is normal. No respiratory distress.     Breath sounds: Normal breath sounds. No stridor. No wheezing, rhonchi or rales.  Chest:     Chest wall: No tenderness.  Abdominal:     General: Abdomen is flat. Bowel sounds are normal. There is no distension.     Palpations: Abdomen is soft. There is no mass.     Tenderness: There is no abdominal tenderness. There is no right CVA tenderness, left CVA tenderness, guarding or rebound.     Hernia: No hernia is present.  Genitourinary:    Comments: Genital exam deferred with shared decision making Musculoskeletal:        General: No swelling, tenderness, deformity or signs of injury.     Cervical back: Normal range of motion and neck supple. No rigidity. No muscular tenderness.     Right lower leg: No edema.     Left lower leg: No edema.  Lymphadenopathy:     Cervical: No cervical adenopathy.  Skin:    General: Skin is warm and dry.     Capillary Refill: Capillary refill takes less than 2 seconds.     Coloration: Skin is not jaundiced or pale.     Findings: No bruising, erythema, lesion or rash.  Neurological:     General: No focal deficit present.     Mental Status: He is alert and oriented to person, place, and time.     Cranial Nerves: No cranial nerve deficit.     Sensory: No sensory deficit.     Motor: No weakness.     Coordination: Coordination normal.     Gait: Gait normal.      Deep Tendon Reflexes: Reflexes normal.  Psychiatric:        Mood and Affect: Mood normal.        Behavior: Behavior normal.        Thought Content: Thought content normal.        Judgment: Judgment normal.    Results for orders placed or performed in visit on 01/20/21  Comprehensive metabolic panel  Result Value Ref Range   Glucose 94 65 - 99 mg/dL   BUN 13 8 - 27 mg/dL   Creatinine, Ser 1.00 0.76 - 1.27 mg/dL   eGFR 85 >59 mL/min/1.73   BUN/Creatinine Ratio 13 10 - 24   Sodium 139 134 - 144 mmol/L   Potassium 4.3 3.5 - 5.2 mmol/L   Chloride 101 96 - 106 mmol/L   CO2 25 20 - 29 mmol/L   Calcium 9.3 8.6 - 10.2 mg/dL   Total Protein 6.9 6.0 - 8.5 g/dL   Albumin 4.5 3.8 - 4.8 g/dL   Globulin, Total 2.4 1.5 - 4.5 g/dL   Albumin/Globulin Ratio 1.9 1.2 - 2.2   Bilirubin Total 0.6 0.0 - 1.2 mg/dL   Alkaline Phosphatase 52 44 - 121 IU/L   AST 24 0 - 40 IU/L   ALT 20 0 - 44 IU/L  CBC with Differential/Platelet  Result Value Ref Range   WBC 5.4 3.4 - 10.8 x10E3/uL   RBC 4.66 4.14 -  5.80 x10E6/uL   Hemoglobin 14.7 13.0 - 17.7 g/dL   Hematocrit 43.5 37.5 - 51.0 %   MCV 93 79 - 97 fL   MCH 31.5 26.6 - 33.0 pg   MCHC 33.8 31.5 - 35.7 g/dL   RDW 12.7 11.6 - 15.4 %   Platelets 272 150 - 450 x10E3/uL   Neutrophils 51 Not Estab. %   Lymphs 35 Not Estab. %   Monocytes 11 Not Estab. %   Eos 2 Not Estab. %   Basos 1 Not Estab. %   Neutrophils Absolute 2.8 1.4 - 7.0 x10E3/uL   Lymphocytes Absolute 1.9 0.7 - 3.1 x10E3/uL   Monocytes Absolute 0.6 0.1 - 0.9 x10E3/uL   EOS (ABSOLUTE) 0.1 0.0 - 0.4 x10E3/uL   Basophils Absolute 0.0 0.0 - 0.2 x10E3/uL   Immature Granulocytes 0 Not Estab. %   Immature Grans (Abs) 0.0 0.0 - 0.1 x10E3/uL  Lipid Panel w/o Chol/HDL Ratio  Result Value Ref Range   Cholesterol, Total 207 (H) 100 - 199 mg/dL   Triglycerides 66 0 - 149 mg/dL   HDL 69 >39 mg/dL   VLDL Cholesterol Cal 12 5 - 40 mg/dL   LDL Chol Calc (NIH) 126 (H) 0 - 99 mg/dL  PSA  Result  Value Ref Range   Prostate Specific Ag, Serum 0.5 0.0 - 4.0 ng/mL  TSH  Result Value Ref Range   TSH 1.870 0.450 - 4.500 uIU/mL  Urinalysis, Routine w reflex microscopic  Result Value Ref Range   Specific Gravity, UA 1.020 1.005 - 1.030   pH, UA 7.0 5.0 - 7.5   Color, UA Yellow Yellow   Appearance Ur Clear Clear   Leukocytes,UA Negative Negative   Protein,UA Negative Negative/Trace   Glucose, UA Negative Negative   Ketones, UA Negative Negative   RBC, UA Negative Negative   Bilirubin, UA Negative Negative   Urobilinogen, Ur 0.2 0.2 - 1.0 mg/dL   Nitrite, UA Negative Negative  HM COLONOSCOPY  Result Value Ref Range   HM Colonoscopy See Report (in chart) See Report (in chart), Patient Reported      Assessment & Plan:   Problem List Items Addressed This Visit   None Visit Diagnoses     Routine general medical examination at a health care facility    -  Primary   Vaccines up to date. Screening labs checked today. Colonoscopy up to date. Continue diet and exercise. Call with any concerns.   Relevant Orders   Comprehensive metabolic panel   CBC with Differential/Platelet   Lipid Panel w/o Chol/HDL Ratio   PSA   TSH   Urinalysis, Routine w reflex microscopic        Discussed aspirin prophylaxis for myocardial infarction prevention and decision was it was not indicated  LABORATORY TESTING:  Health maintenance labs ordered today as discussed above.   The natural history of prostate cancer and ongoing controversy regarding screening and potential treatment outcomes of prostate cancer has been discussed with the patient. The meaning of a false positive PSA and a false negative PSA has been discussed. He indicates understanding of the limitations of this screening test and wishes  to proceed with screening PSA testing.   IMMUNIZATIONS:   - Tdap: Tetanus vaccination status reviewed: last tetanus booster within 10 years. - Influenza: Up to date - Pneumovax: Not applicable -  Prevnar: Not applicable - COVID: Up to date - HPV: Not applicable - Shingrix vaccine: Up to date  SCREENING: - Colonoscopy: Up to date  Discussed with patient purpose of the colonoscopy is to detect colon cancer at curable precancerous or early stages   PATIENT COUNSELING:    Sexuality: Discussed sexually transmitted diseases, partner selection, use of condoms, avoidance of unintended pregnancy  and contraceptive alternatives.   Advised to avoid cigarette smoking.  I discussed with the patient that most people either abstain from alcohol or drink within safe limits (<=14/week and <=4 drinks/occasion for males, <=7/weeks and <= 3 drinks/occasion for females) and that the risk for alcohol disorders and other health effects rises proportionally with the number of drinks per week and how often a drinker exceeds daily limits.  Discussed cessation/primary prevention of drug use and availability of treatment for abuse.   Diet: Encouraged to adjust caloric intake to maintain  or achieve ideal body weight, to reduce intake of dietary saturated fat and total fat, to limit sodium intake by avoiding high sodium foods and not adding table salt, and to maintain adequate dietary potassium and calcium preferably from fresh fruits, vegetables, and low-fat dairy products.    stressed the importance of regular exercise  Injury prevention: Discussed safety belts, safety helmets, smoke detector, smoking near bedding or upholstery.   Dental health: Discussed importance of regular tooth brushing, flossing, and dental visits.   Follow up plan: NEXT PREVENTATIVE PHYSICAL DUE IN 1 YEAR. Return in about 1 year (around 01/23/2023) for physical.

## 2022-01-23 LAB — LIPID PANEL W/O CHOL/HDL RATIO

## 2022-01-24 LAB — COMPREHENSIVE METABOLIC PANEL
ALT: 12 IU/L (ref 0–44)
AST: 19 IU/L (ref 0–40)
Albumin/Globulin Ratio: 1.9 (ref 1.2–2.2)
Albumin: 4.5 g/dL (ref 3.8–4.8)
Alkaline Phosphatase: 51 IU/L (ref 44–121)
BUN/Creatinine Ratio: 17 (ref 10–24)
BUN: 16 mg/dL (ref 8–27)
Bilirubin Total: 0.5 mg/dL (ref 0.0–1.2)
CO2: 22 mmol/L (ref 20–29)
Calcium: 9.3 mg/dL (ref 8.6–10.2)
Chloride: 103 mmol/L (ref 96–106)
Creatinine, Ser: 0.93 mg/dL (ref 0.76–1.27)
Globulin, Total: 2.4 g/dL (ref 1.5–4.5)
Glucose: 90 mg/dL (ref 70–99)
Potassium: 4.5 mmol/L (ref 3.5–5.2)
Sodium: 141 mmol/L (ref 134–144)
Total Protein: 6.9 g/dL (ref 6.0–8.5)
eGFR: 92 mL/min/{1.73_m2} (ref >59)

## 2022-01-24 LAB — CBC WITH DIFFERENTIAL/PLATELET
Basophils Absolute: 0 10*3/uL (ref 0.0–0.2)
Basos: 1 %
EOS (ABSOLUTE): 0.1 10*3/uL (ref 0.0–0.4)
Eos: 2 %
Hematocrit: 41.4 % (ref 37.5–51.0)
Hemoglobin: 14.1 g/dL (ref 13.0–17.7)
Immature Grans (Abs): 0 10*3/uL (ref 0.0–0.1)
Immature Granulocytes: 0 %
Lymphocytes Absolute: 1.7 10*3/uL (ref 0.7–3.1)
Lymphs: 37 %
MCH: 30.9 pg (ref 26.6–33.0)
MCHC: 34.1 g/dL (ref 31.5–35.7)
MCV: 91 fL (ref 79–97)
Monocytes Absolute: 0.5 10*3/uL (ref 0.1–0.9)
Monocytes: 12 %
Neutrophils Absolute: 2.1 10*3/uL (ref 1.4–7.0)
Neutrophils: 48 %
Platelets: 271 10*3/uL (ref 150–450)
RBC: 4.56 x10E6/uL (ref 4.14–5.80)
RDW: 13 % (ref 11.6–15.4)
WBC: 4.5 10*3/uL (ref 3.4–10.8)

## 2022-01-24 LAB — PSA: Prostate Specific Ag, Serum: 0.5 ng/mL (ref 0.0–4.0)

## 2022-01-24 LAB — LIPID PANEL W/O CHOL/HDL RATIO
Cholesterol, Total: 207 mg/dL — ABNORMAL HIGH (ref 100–199)
HDL: 54 mg/dL (ref >39)
LDL Chol Calc (NIH): 141 mg/dL — ABNORMAL HIGH (ref 0–99)
Triglycerides: 68 mg/dL (ref 0–149)
VLDL Cholesterol Cal: 12 mg/dL (ref 5–40)

## 2022-01-24 LAB — TSH: TSH: 1.42 u[IU]/mL (ref 0.450–4.500)

## 2022-06-19 DIAGNOSIS — M25512 Pain in left shoulder: Secondary | ICD-10-CM | POA: Diagnosis not present

## 2022-06-19 DIAGNOSIS — M9902 Segmental and somatic dysfunction of thoracic region: Secondary | ICD-10-CM | POA: Diagnosis not present

## 2022-06-19 DIAGNOSIS — M9901 Segmental and somatic dysfunction of cervical region: Secondary | ICD-10-CM | POA: Diagnosis not present

## 2022-06-19 DIAGNOSIS — M5414 Radiculopathy, thoracic region: Secondary | ICD-10-CM | POA: Diagnosis not present

## 2022-06-19 DIAGNOSIS — M50322 Other cervical disc degeneration at C5-C6 level: Secondary | ICD-10-CM | POA: Diagnosis not present

## 2022-06-19 DIAGNOSIS — M5451 Vertebrogenic low back pain: Secondary | ICD-10-CM | POA: Diagnosis not present

## 2022-06-19 DIAGNOSIS — M5412 Radiculopathy, cervical region: Secondary | ICD-10-CM | POA: Diagnosis not present

## 2022-06-19 DIAGNOSIS — M50323 Other cervical disc degeneration at C6-C7 level: Secondary | ICD-10-CM | POA: Diagnosis not present

## 2022-06-19 DIAGNOSIS — M9904 Segmental and somatic dysfunction of sacral region: Secondary | ICD-10-CM | POA: Diagnosis not present

## 2022-06-19 DIAGNOSIS — M25511 Pain in right shoulder: Secondary | ICD-10-CM | POA: Diagnosis not present

## 2022-06-19 DIAGNOSIS — M9903 Segmental and somatic dysfunction of lumbar region: Secondary | ICD-10-CM | POA: Diagnosis not present

## 2022-06-19 DIAGNOSIS — M546 Pain in thoracic spine: Secondary | ICD-10-CM | POA: Diagnosis not present

## 2022-09-08 DIAGNOSIS — M546 Pain in thoracic spine: Secondary | ICD-10-CM | POA: Diagnosis not present

## 2022-09-08 DIAGNOSIS — M25512 Pain in left shoulder: Secondary | ICD-10-CM | POA: Diagnosis not present

## 2022-09-08 DIAGNOSIS — M5414 Radiculopathy, thoracic region: Secondary | ICD-10-CM | POA: Diagnosis not present

## 2022-09-08 DIAGNOSIS — M50323 Other cervical disc degeneration at C6-C7 level: Secondary | ICD-10-CM | POA: Diagnosis not present

## 2022-09-08 DIAGNOSIS — M9902 Segmental and somatic dysfunction of thoracic region: Secondary | ICD-10-CM | POA: Diagnosis not present

## 2022-09-08 DIAGNOSIS — M5451 Vertebrogenic low back pain: Secondary | ICD-10-CM | POA: Diagnosis not present

## 2022-09-08 DIAGNOSIS — M5412 Radiculopathy, cervical region: Secondary | ICD-10-CM | POA: Diagnosis not present

## 2022-09-08 DIAGNOSIS — M9904 Segmental and somatic dysfunction of sacral region: Secondary | ICD-10-CM | POA: Diagnosis not present

## 2022-09-08 DIAGNOSIS — M9903 Segmental and somatic dysfunction of lumbar region: Secondary | ICD-10-CM | POA: Diagnosis not present

## 2022-09-08 DIAGNOSIS — M25511 Pain in right shoulder: Secondary | ICD-10-CM | POA: Diagnosis not present

## 2022-09-08 DIAGNOSIS — M9901 Segmental and somatic dysfunction of cervical region: Secondary | ICD-10-CM | POA: Diagnosis not present

## 2022-09-08 DIAGNOSIS — M50322 Other cervical disc degeneration at C5-C6 level: Secondary | ICD-10-CM | POA: Diagnosis not present

## 2022-11-27 DIAGNOSIS — Z23 Encounter for immunization: Secondary | ICD-10-CM | POA: Diagnosis not present

## 2023-02-01 ENCOUNTER — Ambulatory Visit (INDEPENDENT_AMBULATORY_CARE_PROVIDER_SITE_OTHER): Payer: 59 | Admitting: Family Medicine

## 2023-02-01 ENCOUNTER — Encounter: Payer: Self-pay | Admitting: Family Medicine

## 2023-02-01 VITALS — BP 117/71 | HR 60 | Temp 97.8°F | Ht 69.0 in | Wt 181.0 lb

## 2023-02-01 DIAGNOSIS — Z0000 Encounter for general adult medical examination without abnormal findings: Secondary | ICD-10-CM

## 2023-02-01 LAB — URINALYSIS, ROUTINE W REFLEX MICROSCOPIC
Glucose, UA: NEGATIVE
Ketones, UA: NEGATIVE
Leukocytes,UA: NEGATIVE
Nitrite, UA: NEGATIVE
Protein,UA: NEGATIVE
RBC, UA: NEGATIVE
Specific Gravity, UA: 1.025 (ref 1.005–1.030)
Urobilinogen, Ur: 0.2 mg/dL (ref 0.2–1.0)
pH, UA: 5.5 (ref 5.0–7.5)

## 2023-02-01 NOTE — Progress Notes (Signed)
BP 117/71   Pulse 60   Temp 97.8 F (36.6 C) (Oral)   Ht '5\' 9"'$  (1.753 m)   Wt 181 lb (82.1 kg)   SpO2 97%   BMI 26.73 kg/m    Subjective:    Patient ID: Thomas Whitney, male    DOB: 08/02/1961, 62 y.o.   MRN: [id number]  HPI: Thomas Whitney is a 62 y.o. male presenting on 02/01/2023 for comprehensive medical examination. Current medical complaints include:none  He currently lives with: wife Interim Problems from his last visit: no  Depression Screen done today and results listed below:     02/01/2023    8:10 AM 01/22/2022    8:12 AM 01/20/2021    8:05 AM 01/19/2020    8:20 AM 01/17/2019    8:08 AM  Depression screen PHQ 2/9  Decreased Interest 0 0 0 0 0  Down, Depressed, Hopeless 0 0 0 0 0  PHQ - 2 Score 0 0 0 0 0  Altered sleeping 0 0  0 0  Tired, decreased energy 0 0  0 0  Change in appetite 0 0  0 0  Feeling bad or failure about yourself  0 0  0 0  Trouble concentrating 0 0  0 0  Moving slowly or fidgety/restless 0 0  0 0  Suicidal thoughts 0 0  0 0  PHQ-9 Score 0 0  0 0  Difficult doing work/chores Not difficult at all Not difficult at all  Not difficult at all Not difficult at all    Past Medical History:  Past Medical History:  Diagnosis Date   GERD (gastroesophageal reflux disease)    Headache     Surgical History:  Past Surgical History:  Procedure Laterality Date   COLONOSCOPY  2013   CYSTOSCOPY WITH STENT PLACEMENT Left 05/18/2017   Procedure: CYSTOSCOPY WITH STENT PLACEMENT;  Surgeon: Royston Cowper, MD;  Location: [facility];  Service: Urology;  Laterality: Left;   EXTRACORPOREAL SHOCK WAVE LITHOTRIPSY Left 05/13/2017   Procedure: EXTRACORPOREAL SHOCK WAVE LITHOTRIPSY (ESWL);  Surgeon: Royston Cowper, MD;  Location: [facility];  Service: Urology;  Laterality: Left;   HERNIA REPAIR Right 11/03/2010   Direct inguinal hernia, large Ultra Pro mesh.   INGUINAL HERNIA REPAIR Left 02/10/2016   Procedure: HERNIA REPAIR INGUINAL ADULT;  Surgeon: Robert Bellow, MD;  Location: [facility];  Service: General;  Laterality: Left;   INGUINAL HERNIA REPAIR Left 02/10/2016   KIDNEY STONE SURGERY     2001, 2006   TONSILLECTOMY  1966   VASECTOMY  1992    Medications:  Current Outpatient Medications on File Prior to Visit  Medication Sig   aspirin-acetaminophen-caffeine (EXCEDRIN MIGRAINE) 250-250-65 MG tablet Take 2 tablets by mouth every 8 (eight) hours as needed for headache.   ibuprofen (ADVIL,MOTRIN) 200 MG tablet Take 400 mg by mouth every 8 (eight) hours as needed (for pain.).    loratadine (CLARITIN) 10 MG tablet Take 10 mg by mouth daily as needed (for allergies.).    omeprazole (PRILOSEC) 20 MG capsule Take 20 mg by mouth daily as needed (for acid reflux/indigestion.).    Soft Lens Products (REWETTING DROPS) SOLN Place 1 drop into both eyes daily as needed (for contact lenses.).   No current facility-administered medications on file prior to visit.    Allergies:  No Known Allergies  Social History:  Social History   Socioeconomic History   Marital status: Married    Spouse name: Not on  file   Number of children: Not on file   Years of education: Not on file   Highest education level: Not on file  Occupational History   Not on file  Tobacco Use   Smoking status: Former    Years: 20.00    Types: Cigarettes    Quit date: 01/07/2016    Years since quitting: 7.0   Smokeless tobacco: Former    Types: Snuff  Vaping Use   Vaping Use: Never used  Substance and Sexual Activity   Alcohol use: Yes    Alcohol/week: 0.0 standard drinks of alcohol    Comment: on occasion   Drug use: No   Sexual activity: Yes  Other Topics Concern   Not on file  Social History Narrative   Not on file   Social Determinants of Health   Financial Resource Strain: Not on file  Food Insecurity: Not on file  Transportation Needs: Not on file  Physical Activity: Not on file  Stress: Not on file  Social Connections: Not on file  Intimate Partner  Violence: Not on file   Social History   Tobacco Use  Smoking Status Former   Years: 20.00   Types: Cigarettes   Quit date: 01/07/2016   Years since quitting: 7.0  Smokeless Tobacco Former   Types: Snuff   Social History   Substance and Sexual Activity  Alcohol Use Yes   Alcohol/week: 0.0 standard drinks of alcohol   Comment: on occasion    Family History:  Family History  Problem Relation Age of Onset   Dementia Mother    Hypertension Mother    Diabetes Father    Diabetes Maternal Grandfather    Diabetes Paternal Grandfather     Past medical history, surgical history, medications, allergies, family history and social history reviewed with patient today and changes made to appropriate areas of the chart.   Review of Systems  Constitutional: Negative.   HENT: Negative.         Getting over a cold  Eyes: Negative.   Respiratory: Negative.    Cardiovascular: Negative.   Gastrointestinal: Negative.   Genitourinary: Negative.   Musculoskeletal: Negative.   Skin: Negative.   Neurological: Negative.   Endo/Heme/Allergies: Negative.   Psychiatric/Behavioral: Negative.     All other ROS negative except what is listed above and in the HPI.      Objective:    BP 117/71   Pulse 60   Temp 97.8 F (36.6 C) (Oral)   Ht '5\' 9"'$  (1.753 m)   Wt 181 lb (82.1 kg)   SpO2 97%   BMI 26.73 kg/m   Wt Readings from Last 3 Encounters:  02/01/23 181 lb (82.1 kg)  01/22/22 178 lb 3.2 oz (80.8 kg)  03/10/21 186 lb (84.4 kg)    Physical Exam Vitals and nursing note reviewed.  Constitutional:      General: He is not in acute distress.    Appearance: Normal appearance. He is normal weight. He is not ill-appearing, toxic-appearing or diaphoretic.  HENT:     Head: Normocephalic and atraumatic.     Right Ear: Tympanic membrane, ear canal and external ear normal. There is no impacted cerumen.     Left Ear: Tympanic membrane, ear canal and external ear normal. There is no impacted  cerumen.     Nose: Nose normal. No congestion or rhinorrhea.     Mouth/Throat:     Mouth: Mucous membranes are moist.     Pharynx: Oropharynx is clear.  No oropharyngeal exudate or posterior oropharyngeal erythema.  Eyes:     General: No scleral icterus.       Right eye: No discharge.        Left eye: No discharge.     Extraocular Movements: Extraocular movements intact.     Conjunctiva/sclera: Conjunctivae normal.     Pupils: Pupils are equal, round, and reactive to light.  Neck:     Vascular: No carotid bruit.  Cardiovascular:     Rate and Rhythm: Normal rate and regular rhythm.     Pulses: Normal pulses.     Heart sounds: No murmur heard.    No friction rub. No gallop.  Pulmonary:     Effort: Pulmonary effort is normal. No respiratory distress.     Breath sounds: Normal breath sounds. No stridor. No wheezing, rhonchi or rales.  Chest:     Chest wall: No tenderness.  Abdominal:     General: Abdomen is flat. Bowel sounds are normal. There is no distension.     Palpations: Abdomen is soft. There is no mass.     Tenderness: There is no abdominal tenderness. There is no right CVA tenderness, left CVA tenderness, guarding or rebound.     Hernia: No hernia is present.  Genitourinary:    Comments: Genital exam deferred with shared decision making Musculoskeletal:        General: No swelling, tenderness, deformity or signs of injury.     Cervical back: Normal range of motion and neck supple. No rigidity. No muscular tenderness.     Right lower leg: No edema.     Left lower leg: No edema.  Lymphadenopathy:     Cervical: No cervical adenopathy.  Skin:    General: Skin is warm and dry.     Capillary Refill: Capillary refill takes less than 2 seconds.     Coloration: Skin is not jaundiced or pale.     Findings: No bruising, erythema, lesion or rash.  Neurological:     General: No focal deficit present.     Mental Status: He is alert and oriented to person, place, and time.      Cranial Nerves: No cranial nerve deficit.     Sensory: No sensory deficit.     Motor: No weakness.     Coordination: Coordination normal.     Gait: Gait normal.     Deep Tendon Reflexes: Reflexes normal.  Psychiatric:        Mood and Affect: Mood normal.        Behavior: Behavior normal.        Thought Content: Thought content normal.        Judgment: Judgment normal.     Results for orders placed or performed in visit on 01/22/22  Comprehensive metabolic panel  Result Value Ref Range   Glucose 90 70 - 99 mg/dL   BUN 16 8 - 27 mg/dL   Creatinine, Ser 0.93 0.76 - 1.27 mg/dL   eGFR 92 >59 mL/min/1.73   BUN/Creatinine Ratio 17 10 - 24   Sodium 141 134 - 144 mmol/L   Potassium 4.5 3.5 - 5.2 mmol/L   Chloride 103 96 - 106 mmol/L   CO2 22 20 - 29 mmol/L   Calcium 9.3 8.6 - 10.2 mg/dL   Total Protein 6.9 6.0 - 8.5 g/dL   Albumin 4.5 3.8 - 4.8 g/dL   Globulin, Total 2.4 1.5 - 4.5 g/dL   Albumin/Globulin Ratio 1.9 1.2 - 2.2   Bilirubin Total  0.5 0.0 - 1.2 mg/dL   Alkaline Phosphatase 51 44 - 121 IU/L   AST 19 0 - 40 IU/L   ALT 12 0 - 44 IU/L  CBC with Differential/Platelet  Result Value Ref Range   WBC 4.5 3.4 - 10.8 x10E3/uL   RBC 4.56 4.14 - 5.80 x10E6/uL   Hemoglobin 14.1 13.0 - 17.7 g/dL   Hematocrit 41.4 37.5 - 51.0 %   MCV 91 79 - 97 fL   MCH 30.9 26.6 - 33.0 pg   MCHC 34.1 31.5 - 35.7 g/dL   RDW 13.0 11.6 - 15.4 %   Platelets 271 150 - 450 x10E3/uL   Neutrophils 48 Not Estab. %   Lymphs 37 Not Estab. %   Monocytes 12 Not Estab. %   Eos 2 Not Estab. %   Basos 1 Not Estab. %   Neutrophils Absolute 2.1 1.4 - 7.0 x10E3/uL   Lymphocytes Absolute 1.7 0.7 - 3.1 x10E3/uL   Monocytes Absolute 0.5 0.1 - 0.9 x10E3/uL   EOS (ABSOLUTE) 0.1 0.0 - 0.4 x10E3/uL   Basophils Absolute 0.0 0.0 - 0.2 x10E3/uL   Immature Granulocytes 0 Not Estab. %   Immature Grans (Abs) 0.0 0.0 - 0.1 x10E3/uL  Lipid Panel w/o Chol/HDL Ratio  Result Value Ref Range   Cholesterol, Total 207 (H) 100  - 199 mg/dL   Triglycerides 68 0 - 149 mg/dL   HDL 54 >39 mg/dL   VLDL Cholesterol Cal 12 5 - 40 mg/dL   LDL Chol Calc (NIH) 141 (H) 0 - 99 mg/dL  PSA  Result Value Ref Range   Prostate Specific Ag, Serum 0.5 0.0 - 4.0 ng/mL  TSH  Result Value Ref Range   TSH 1.420 0.450 - 4.500 uIU/mL  Urinalysis, Routine w reflex microscopic  Result Value Ref Range   Specific Gravity, UA >1.030 (H) 1.005 - 1.030   pH, UA 5.5 5.0 - 7.5   Color, UA Yellow Yellow   Appearance Ur Clear Clear   Leukocytes,UA Negative Negative   Protein,UA Negative Negative/Trace   Glucose, UA Negative Negative   Ketones, UA 3+ (A) Negative   RBC, UA Negative Negative   Bilirubin, UA Negative Negative   Urobilinogen, Ur 0.2 0.2 - 1.0 mg/dL   Nitrite, UA Negative Negative      Assessment & Plan:   Problem List Items Addressed This Visit   None Visit Diagnoses     Routine general medical examination at a health care facility    -  Primary   Vaccines up to date. Screening labs checked today. Colonoscopy up to date. Continue diet and exercise. Call with any concerns.   Relevant Orders   Comprehensive metabolic panel   CBC with Differential/Platelet   Lipid Panel w/o Chol/HDL Ratio   PSA   TSH   Urinalysis, Routine w reflex microscopic        Discussed aspirin prophylaxis for myocardial infarction prevention and decision was it was not indicated  LABORATORY TESTING:  Health maintenance labs ordered today as discussed above.   The natural history of prostate cancer and ongoing controversy regarding screening and potential treatment outcomes of prostate cancer has been discussed with the patient. The meaning of a false positive PSA and a false negative PSA has been discussed. He indicates understanding of the limitations of this screening test and wishes to proceed with screening PSA testing.   IMMUNIZATIONS:   - Tdap: Tetanus vaccination status reviewed: last tetanus booster within 10 years. - Influenza:  Up  to date - Pneumovax: Not applicable - Prevnar: Not applicable - COVID: Not applicable - HPV: Not applicable - Shingrix vaccine: Up to date  SCREENING: - Colonoscopy: Up to date  Discussed with patient purpose of the colonoscopy is to detect colon cancer at curable precancerous or early stages   PATIENT COUNSELING:    Sexuality: Discussed sexually transmitted diseases, partner selection, use of condoms, avoidance of unintended pregnancy  and contraceptive alternatives.   Advised to avoid cigarette smoking.  I discussed with the patient that most people either abstain from alcohol or drink within safe limits (<=14/week and <=4 drinks/occasion for males, <=7/weeks and <= 3 drinks/occasion for females) and that the risk for alcohol disorders and other health effects rises proportionally with the number of drinks per week and how often a drinker exceeds daily limits.  Discussed cessation/primary prevention of drug use and availability of treatment for abuse.   Diet: Encouraged to adjust caloric intake to maintain  or achieve ideal body weight, to reduce intake of dietary saturated fat and total fat, to limit sodium intake by avoiding high sodium foods and not adding table salt, and to maintain adequate dietary potassium and calcium preferably from fresh fruits, vegetables, and low-fat dairy products.    stressed the importance of regular exercise  Injury prevention: Discussed safety belts, safety helmets, smoke detector, smoking near bedding or upholstery.   Dental health: Discussed importance of regular tooth brushing, flossing, and dental visits.   Follow up plan: NEXT PREVENTATIVE PHYSICAL DUE IN 1 YEAR. Return in about 1 year (around 02/01/2024) for physical.

## 2023-02-02 LAB — CBC WITH DIFFERENTIAL/PLATELET
Basophils Absolute: 0.1 10*3/uL (ref 0.0–0.2)
Basos: 1 %
EOS (ABSOLUTE): 0.1 10*3/uL (ref 0.0–0.4)
Eos: 2 %
Hematocrit: 42.8 % (ref 37.5–51.0)
Hemoglobin: 14.7 g/dL (ref 13.0–17.7)
Immature Grans (Abs): 0 10*3/uL (ref 0.0–0.1)
Immature Granulocytes: 0 %
Lymphocytes Absolute: 2.2 10*3/uL (ref 0.7–3.1)
Lymphs: 42 %
MCH: 31.9 pg (ref 26.6–33.0)
MCHC: 34.3 g/dL (ref 31.5–35.7)
MCV: 93 fL (ref 79–97)
Monocytes Absolute: 0.6 10*3/uL (ref 0.1–0.9)
Monocytes: 11 %
Neutrophils Absolute: 2.3 10*3/uL (ref 1.4–7.0)
Neutrophils: 44 %
Platelets: 269 10*3/uL (ref 150–450)
RBC: 4.61 x10E6/uL (ref 4.14–5.80)
RDW: 12.9 % (ref 11.6–15.4)
WBC: 5.3 10*3/uL (ref 3.4–10.8)

## 2023-02-02 LAB — LIPID PANEL W/O CHOL/HDL RATIO
Cholesterol, Total: 188 mg/dL (ref 100–199)
HDL: 58 mg/dL (ref >39)
LDL Chol Calc (NIH): 118 mg/dL — ABNORMAL HIGH (ref 0–99)
Triglycerides: 66 mg/dL (ref 0–149)
VLDL Cholesterol Cal: 12 mg/dL (ref 5–40)

## 2023-02-02 LAB — COMPREHENSIVE METABOLIC PANEL
ALT: 13 IU/L (ref 0–44)
AST: 18 IU/L (ref 0–40)
Albumin/Globulin Ratio: 1.9 (ref 1.2–2.2)
Albumin: 4.3 g/dL (ref 3.9–4.9)
Alkaline Phosphatase: 54 IU/L (ref 44–121)
BUN/Creatinine Ratio: 11 (ref 10–24)
BUN: 11 mg/dL (ref 8–27)
Bilirubin Total: 0.4 mg/dL (ref 0.0–1.2)
CO2: 23 mmol/L (ref 20–29)
Calcium: 9.2 mg/dL (ref 8.6–10.2)
Chloride: 104 mmol/L (ref 96–106)
Creatinine, Ser: 1.03 mg/dL (ref 0.76–1.27)
Globulin, Total: 2.3 g/dL (ref 1.5–4.5)
Glucose: 96 mg/dL (ref 70–99)
Potassium: 4.3 mmol/L (ref 3.5–5.2)
Sodium: 144 mmol/L (ref 134–144)
Total Protein: 6.6 g/dL (ref 6.0–8.5)
eGFR: 81 mL/min/{1.73_m2} (ref >59)

## 2023-02-02 LAB — PSA: Prostate Specific Ag, Serum: 0.4 ng/mL (ref 0.0–4.0)

## 2023-02-02 LAB — TSH: TSH: 1.85 u[IU]/mL (ref 0.450–4.500)

## 2023-05-19 DIAGNOSIS — M9903 Segmental and somatic dysfunction of lumbar region: Secondary | ICD-10-CM | POA: Diagnosis not present

## 2023-05-19 DIAGNOSIS — M25511 Pain in right shoulder: Secondary | ICD-10-CM | POA: Diagnosis not present

## 2023-05-19 DIAGNOSIS — M5414 Radiculopathy, thoracic region: Secondary | ICD-10-CM | POA: Diagnosis not present

## 2023-05-19 DIAGNOSIS — M25512 Pain in left shoulder: Secondary | ICD-10-CM | POA: Diagnosis not present

## 2023-05-19 DIAGNOSIS — M50323 Other cervical disc degeneration at C6-C7 level: Secondary | ICD-10-CM | POA: Diagnosis not present

## 2023-05-19 DIAGNOSIS — M9902 Segmental and somatic dysfunction of thoracic region: Secondary | ICD-10-CM | POA: Diagnosis not present

## 2023-05-19 DIAGNOSIS — M5412 Radiculopathy, cervical region: Secondary | ICD-10-CM | POA: Diagnosis not present

## 2023-05-19 DIAGNOSIS — M9904 Segmental and somatic dysfunction of sacral region: Secondary | ICD-10-CM | POA: Diagnosis not present

## 2023-05-19 DIAGNOSIS — M50322 Other cervical disc degeneration at C5-C6 level: Secondary | ICD-10-CM | POA: Diagnosis not present

## 2023-05-19 DIAGNOSIS — M9901 Segmental and somatic dysfunction of cervical region: Secondary | ICD-10-CM | POA: Diagnosis not present

## 2023-11-01 DIAGNOSIS — M50322 Other cervical disc degeneration at C5-C6 level: Secondary | ICD-10-CM | POA: Diagnosis not present

## 2023-11-01 DIAGNOSIS — M50323 Other cervical disc degeneration at C6-C7 level: Secondary | ICD-10-CM | POA: Diagnosis not present

## 2023-11-01 DIAGNOSIS — M9903 Segmental and somatic dysfunction of lumbar region: Secondary | ICD-10-CM | POA: Diagnosis not present

## 2023-11-01 DIAGNOSIS — M9904 Segmental and somatic dysfunction of sacral region: Secondary | ICD-10-CM | POA: Diagnosis not present

## 2023-11-01 DIAGNOSIS — M9902 Segmental and somatic dysfunction of thoracic region: Secondary | ICD-10-CM | POA: Diagnosis not present

## 2023-11-01 DIAGNOSIS — M5451 Vertebrogenic low back pain: Secondary | ICD-10-CM | POA: Diagnosis not present

## 2023-11-01 DIAGNOSIS — M7918 Myalgia, other site: Secondary | ICD-10-CM | POA: Diagnosis not present

## 2023-11-01 DIAGNOSIS — M5412 Radiculopathy, cervical region: Secondary | ICD-10-CM | POA: Diagnosis not present

## 2023-11-01 DIAGNOSIS — M9901 Segmental and somatic dysfunction of cervical region: Secondary | ICD-10-CM | POA: Diagnosis not present

## 2023-11-03 DIAGNOSIS — M9901 Segmental and somatic dysfunction of cervical region: Secondary | ICD-10-CM | POA: Diagnosis not present

## 2023-11-03 DIAGNOSIS — M5412 Radiculopathy, cervical region: Secondary | ICD-10-CM | POA: Diagnosis not present

## 2023-11-03 DIAGNOSIS — M9904 Segmental and somatic dysfunction of sacral region: Secondary | ICD-10-CM | POA: Diagnosis not present

## 2023-11-03 DIAGNOSIS — M7918 Myalgia, other site: Secondary | ICD-10-CM | POA: Diagnosis not present

## 2023-11-03 DIAGNOSIS — M50322 Other cervical disc degeneration at C5-C6 level: Secondary | ICD-10-CM | POA: Diagnosis not present

## 2023-11-03 DIAGNOSIS — M9903 Segmental and somatic dysfunction of lumbar region: Secondary | ICD-10-CM | POA: Diagnosis not present

## 2023-11-03 DIAGNOSIS — M9902 Segmental and somatic dysfunction of thoracic region: Secondary | ICD-10-CM | POA: Diagnosis not present

## 2023-11-03 DIAGNOSIS — M50323 Other cervical disc degeneration at C6-C7 level: Secondary | ICD-10-CM | POA: Diagnosis not present

## 2023-11-03 DIAGNOSIS — M5451 Vertebrogenic low back pain: Secondary | ICD-10-CM | POA: Diagnosis not present

## 2024-02-02 ENCOUNTER — Encounter: Payer: Self-pay | Admitting: Family Medicine

## 2024-02-02 ENCOUNTER — Ambulatory Visit (INDEPENDENT_AMBULATORY_CARE_PROVIDER_SITE_OTHER): Payer: 59 | Admitting: Family Medicine

## 2024-02-02 VITALS — BP 106/72 | HR 66 | Temp 97.9°F | Resp 16 | Ht 68.74 in | Wt 177.8 lb

## 2024-02-02 DIAGNOSIS — Z136 Encounter for screening for cardiovascular disorders: Secondary | ICD-10-CM | POA: Diagnosis not present

## 2024-02-02 DIAGNOSIS — E663 Overweight: Secondary | ICD-10-CM

## 2024-02-02 DIAGNOSIS — Z131 Encounter for screening for diabetes mellitus: Secondary | ICD-10-CM

## 2024-02-02 DIAGNOSIS — Z87891 Personal history of nicotine dependence: Secondary | ICD-10-CM | POA: Diagnosis not present

## 2024-02-02 DIAGNOSIS — R3911 Hesitancy of micturition: Secondary | ICD-10-CM

## 2024-02-02 DIAGNOSIS — Z0000 Encounter for general adult medical examination without abnormal findings: Secondary | ICD-10-CM | POA: Diagnosis not present

## 2024-02-02 LAB — BAYER DCA HB A1C WAIVED: HB A1C (BAYER DCA - WAIVED): 5.5 % (ref 4.8–5.6)

## 2024-02-02 NOTE — Addendum Note (Signed)
 Addended by: Ginette Pitman on: 02/02/2024 08:49 AM   Modules accepted: Orders

## 2024-02-02 NOTE — Patient Instructions (Signed)
 Preventative Services:  Health Risk Assessment and Personalized Prevention Plan: Done today Bone Mass Measurements: N/A CVD Screening: Done today Colon Cancer Screening: up to date Depression Screening: done today Diabetes Screening: done today Glaucoma Screening: see your eye doctor Hepatitis B vaccine: N/A Hepatitis C screening: up to date HIV Screening: up to date Flu Vaccine: up to date Lung cancer Screening: N/A Obesity Screening: Done today Pneumonia Vaccines: Declined STI Screening: N/A PSA screening: Done today   Mr. Olesen , Thank you for taking time to come for your Medicare Wellness Visit. I appreciate your ongoing commitment to your health goals. Please review the following plan we discussed and let me know if I can assist you in the future.   These are the goals we discussed:  Goals   None     This is a list of the screening recommended for you and due dates:  Health Maintenance  Topic Date Due   Pneumonia Vaccine (2 of 2 - PCV) 02/01/2025*   Medicare Annual Wellness Visit  02/01/2025   DTaP/Tdap/Td vaccine (3 - Td or Tdap) 07/09/2030   Colon Cancer Screening  07/19/2030   Flu Shot  Completed   HIV Screening  Completed   Zoster (Shingles) Vaccine  Completed   Hepatitis C Screening  Addressed   HPV Vaccine  Aged Out   COVID-19 Vaccine  Discontinued  *Topic was postponed. The date shown is not the original due date.

## 2024-02-02 NOTE — Progress Notes (Signed)
 BP 106/72 (BP Location: Left Arm, Patient Position: Sitting, Cuff Size: Large)   Pulse 66   Temp 97.9 F (36.6 C) (Oral)   Resp 16   Ht 5' 8.74" (1.746 m)   Wt 177 lb 12.8 oz (80.6 kg)   SpO2 98%   BMI 26.46 kg/m    Subjective:    Patient ID: Thomas Whitney, male    DOB: 01-28-1961, 63 y.o.   MRN: [id number]  HPI: Thomas Whitney is a 63 y.o. male presenting on 02/02/2024 for comprehensive medical examination. Current medical complaints include:none  Interim Problems from his last visit: no  Functional Status Survey: Is the patient deaf or have difficulty hearing?: No Does the patient have difficulty seeing, even when wearing glasses/contacts?: No Does the patient have difficulty concentrating, remembering, or making decisions?: No Does the patient have difficulty walking or climbing stairs?: No Does the patient have difficulty dressing or bathing?: No Does the patient have difficulty doing errands alone such as visiting a doctor's office or shopping?: No  FALL RISK:    02/02/2024    8:04 AM 02/01/2023    8:10 AM 01/22/2022    8:12 AM 01/20/2021    8:05 AM 01/19/2020    8:26 AM  Fall Risk   Falls in the past year? 0 0 0 0 0  Number falls in past yr: 0 0 0 0 0  Injury with Fall? 0 0 0 0 0  Risk for fall due to : No Fall Risks No Fall Risks No Fall Risks No Fall Risks   Follow up Falls evaluation completed Falls evaluation completed Falls evaluation completed Falls evaluation completed     Depression Screen    02/02/2024    8:04 AM 02/01/2023    8:10 AM 01/22/2022    8:12 AM 01/20/2021    8:05 AM 01/19/2020    8:20 AM  Depression screen PHQ 2/9  Decreased Interest 0 0 0 0 0  Down, Depressed, Hopeless 0 0 0 0 0  PHQ - 2 Score 0 0 0 0 0  Altered sleeping  0 0  0  Tired, decreased energy  0 0  0  Change in appetite  0 0  0  Feeling bad or failure about yourself   0 0  0  Trouble concentrating  0 0  0  Moving slowly or fidgety/restless  0 0  0  Suicidal thoughts  0 0  0   PHQ-9 Score  0 0  0  Difficult doing work/chores  Not difficult at all Not difficult at all  Not difficult at all    Advanced Directives Does patient have a HCPOA?    no If yes, name and contact information:  Does patient have a living will or MOST form?  no  Past Medical History:  Past Medical History:  Diagnosis Date   GERD (gastroesophageal reflux disease)    Headache     Surgical History:  Past Surgical History:  Procedure Laterality Date   COLONOSCOPY  2013   CYSTOSCOPY WITH STENT PLACEMENT Left 05/18/2017   Procedure: CYSTOSCOPY WITH STENT PLACEMENT;  Surgeon: Orson Ape, MD;  Location: [facility];  Service: Urology;  Laterality: Left;   EXTRACORPOREAL SHOCK WAVE LITHOTRIPSY Left 05/13/2017   Procedure: EXTRACORPOREAL SHOCK WAVE LITHOTRIPSY (ESWL);  Surgeon: Orson Ape, MD;  Location: [facility];  Service: Urology;  Laterality: Left;   HERNIA REPAIR Right 11/03/2010   Direct inguinal hernia, large Ultra Pro mesh.   INGUINAL  HERNIA REPAIR Left 02/10/2016   Procedure: HERNIA REPAIR INGUINAL ADULT;  Surgeon: Earline Mayotte, MD;  Location: [facility];  Service: General;  Laterality: Left;   INGUINAL HERNIA REPAIR Left 02/10/2016   KIDNEY STONE SURGERY     2001, 2006   TONSILLECTOMY  1966   VASECTOMY  1992    Medications:  Current Outpatient Medications on File Prior to Visit  Medication Sig   aspirin-acetaminophen-caffeine (EXCEDRIN MIGRAINE) 250-250-65 MG tablet Take 2 tablets by mouth every 8 (eight) hours as needed for headache.   ibuprofen (ADVIL,MOTRIN) 200 MG tablet Take 400 mg by mouth every 8 (eight) hours as needed (for pain.).    loratadine (CLARITIN) 10 MG tablet Take 10 mg by mouth daily as needed (for allergies.).    omeprazole (PRILOSEC) 20 MG capsule Take 20 mg by mouth daily as needed (for acid reflux/indigestion.).    Soft Lens Products (REWETTING DROPS) SOLN Place 1 drop into both eyes daily as needed (for contact lenses.).   No current  facility-administered medications on file prior to visit.    Allergies:  No Known Allergies  Social History:  Social History   Socioeconomic History   Marital status: Married    Spouse name: Not on file   Number of children: Not on file   Years of education: Not on file   Highest education level: 12th grade  Occupational History   Not on file  Tobacco Use   Smoking status: Former    Current packs/day: 0.00    Types: Cigarettes    Start date: 01/07/1996    Quit date: 01/07/2016    Years since quitting: 8.0   Smokeless tobacco: Former    Types: Snuff  Vaping Use   Vaping status: Never Used  Substance and Sexual Activity   Alcohol use: Yes    Comment: Occasionally   Drug use: No   Sexual activity: Yes  Other Topics Concern   Not on file  Social History Narrative   Not on file   Social Drivers of Health   Financial Resource Strain: Low Risk  (02/01/2024)   Overall Financial Resource Strain (CARDIA)    Difficulty of Paying Living Expenses: Not hard at all  Food Insecurity: No Food Insecurity (02/01/2024)   Hunger Vital Sign    Worried About Running Out of Food in the Last Year: Never true    Ran Out of Food in the Last Year: Never true  Transportation Needs: No Transportation Needs (02/01/2024)   PRAPARE - Administrator, Civil Service (Medical): No    Lack of Transportation (Non-Medical): No  Physical Activity: Insufficiently Active (02/01/2024)   Exercise Vital Sign    Days of Exercise per Week: 2 days    Minutes of Exercise per Session: 30 min  Stress: No Stress Concern Present (02/01/2024)   [employer] of Occupational Health - Occupational Stress Questionnaire    Feeling of Stress : Not at all  Social Connections: Unknown (02/01/2024)   Social Connection and Isolation Panel [NHANES]    Frequency of Communication with Friends and Family: Once a week    Frequency of Social Gatherings with Friends and Family: Twice a week    Attends Religious  Services: Patient declined    Database administrator or Organizations: Patient declined    Attends Banker Meetings: Not on file    Marital Status: Married  Catering manager Violence: Not on file   Social History   Tobacco Use  Smoking Status  Former   Current packs/day: 0.00   Types: Cigarettes   Start date: 01/07/1996   Quit date: 01/07/2016   Years since quitting: 8.0  Smokeless Tobacco Former   Types: Snuff   Social History   Substance and Sexual Activity  Alcohol Use Yes   Comment: Occasionally    Family History:  Family History  Problem Relation Age of Onset   Dementia Mother    Hypertension Mother    Diabetes Father    Alcohol abuse Father    Diabetes Maternal Grandfather    Diabetes Paternal Grandmother    Diabetes Paternal Grandfather     Past medical history, surgical history, medications, allergies, family history and social history reviewed with patient today and changes made to appropriate areas of the chart.   Review of Systems  Constitutional: Negative.   HENT: Negative.    Eyes: Negative.   Respiratory: Negative.    Cardiovascular: Negative.   Gastrointestinal: Negative.   Genitourinary: Negative.   Musculoskeletal: Negative.   Skin: Negative.   Neurological: Negative.   Endo/Heme/Allergies: Negative.   Psychiatric/Behavioral: Negative.     All other ROS negative except what is listed above and in the HPI.      Objective:    BP 106/72 (BP Location: Left Arm, Patient Position: Sitting, Cuff Size: Large)   Pulse 66   Temp 97.9 F (36.6 C) (Oral)   Resp 16   Ht 5' 8.74" (1.746 m)   Wt 177 lb 12.8 oz (80.6 kg)   SpO2 98%   BMI 26.46 kg/m   Wt Readings from Last 3 Encounters:  02/02/24 177 lb 12.8 oz (80.6 kg)  02/01/23 181 lb (82.1 kg)  01/22/22 178 lb 3.2 oz (80.8 kg)    No results found.  Physical Exam Vitals and nursing note reviewed.  Constitutional:      General: He is not in acute distress.    Appearance:  Normal appearance. He is obese. He is not ill-appearing, toxic-appearing or diaphoretic.  HENT:     Head: Normocephalic and atraumatic.     Right Ear: Tympanic membrane, ear canal and external ear normal. There is no impacted cerumen.     Left Ear: Tympanic membrane, ear canal and external ear normal. There is no impacted cerumen.     Nose: Nose normal. No congestion or rhinorrhea.     Mouth/Throat:     Mouth: Mucous membranes are moist.     Pharynx: Oropharynx is clear. No oropharyngeal exudate or posterior oropharyngeal erythema.  Eyes:     General: No scleral icterus.       Right eye: No discharge.        Left eye: No discharge.     Extraocular Movements: Extraocular movements intact.     Conjunctiva/sclera: Conjunctivae normal.     Pupils: Pupils are equal, round, and reactive to light.  Neck:     Vascular: No carotid bruit.  Cardiovascular:     Rate and Rhythm: Normal rate and regular rhythm.     Pulses: Normal pulses.     Heart sounds: No murmur heard.    No friction rub. No gallop.  Pulmonary:     Effort: Pulmonary effort is normal. No respiratory distress.     Breath sounds: Normal breath sounds. No stridor. No wheezing, rhonchi or rales.  Chest:     Chest wall: No tenderness.  Abdominal:     General: Abdomen is flat. Bowel sounds are normal. There is no distension.     Palpations:  Abdomen is soft. There is no mass.     Tenderness: There is no abdominal tenderness. There is no right CVA tenderness, left CVA tenderness, guarding or rebound.     Hernia: No hernia is present.  Genitourinary:    Comments: Genital exam deferred with shared decision making Musculoskeletal:        General: No swelling, tenderness, deformity or signs of injury.     Cervical back: Normal range of motion and neck supple. No rigidity. No muscular tenderness.     Right lower leg: No edema.     Left lower leg: No edema.  Lymphadenopathy:     Cervical: No cervical adenopathy.  Skin:    General:  Skin is warm and dry.     Capillary Refill: Capillary refill takes less than 2 seconds.     Coloration: Skin is not jaundiced or pale.     Findings: No bruising, erythema, lesion or rash.  Neurological:     General: No focal deficit present.     Mental Status: He is alert and oriented to person, place, and time.     Cranial Nerves: No cranial nerve deficit.     Sensory: No sensory deficit.     Motor: No weakness.     Coordination: Coordination normal.     Gait: Gait normal.     Deep Tendon Reflexes: Reflexes normal.  Psychiatric:        Mood and Affect: Mood normal.        Behavior: Behavior normal.        Thought Content: Thought content normal.        Judgment: Judgment normal.        02/02/2024    8:20 AM  6CIT Screen  What Year? 0 points  What month? 0 points  What time? 0 points  Count back from 20 0 points  Months in reverse 0 points  Repeat phrase 0 points  Total Score 0 points     Results for orders placed or performed in visit on 02/01/23  Urinalysis, Routine w reflex microscopic   Collection Time: 02/01/23  8:24 AM  Result Value Ref Range   Specific Gravity, UA 1.025 1.005 - 1.030   pH, UA 5.5 5.0 - 7.5   Color, UA Yellow Yellow   Appearance Ur Clear Clear   Leukocytes,UA Negative Negative   Protein,UA Negative Negative/Trace   Glucose, UA Negative Negative   Ketones, UA Negative Negative   RBC, UA Negative Negative   Bilirubin, UA CANCELED    Urobilinogen, Ur 0.2 0.2 - 1.0 mg/dL   Nitrite, UA Negative Negative   Microscopic Examination Comment   Comprehensive metabolic panel   Collection Time: 02/01/23  8:26 AM  Result Value Ref Range   Glucose 96 70 - 99 mg/dL   BUN 11 8 - 27 mg/dL   Creatinine, Ser 0.98 0.76 - 1.27 mg/dL   eGFR 81 >11 BJ/YNW/2.95   BUN/Creatinine Ratio 11 10 - 24   Sodium 144 134 - 144 mmol/L   Potassium 4.3 3.5 - 5.2 mmol/L   Chloride 104 96 - 106 mmol/L   CO2 23 20 - 29 mmol/L   Calcium 9.2 8.6 - 10.2 mg/dL   Total  Protein 6.6 6.0 - 8.5 g/dL   Albumin 4.3 3.9 - 4.9 g/dL   Globulin, Total 2.3 1.5 - 4.5 g/dL   Albumin/Globulin Ratio 1.9 1.2 - 2.2   Bilirubin Total 0.4 0.0 - 1.2 mg/dL   Alkaline Phosphatase 54 44 -  121 IU/L   AST 18 0 - 40 IU/L   ALT 13 0 - 44 IU/L  CBC with Differential/Platelet   Collection Time: 02/01/23  8:26 AM  Result Value Ref Range   WBC 5.3 3.4 - 10.8 x10E3/uL   RBC 4.61 4.14 - 5.80 x10E6/uL   Hemoglobin 14.7 13.0 - 17.7 g/dL   Hematocrit 16.1 09.6 - 51.0 %   MCV 93 79 - 97 fL   MCH 31.9 26.6 - 33.0 pg   MCHC 34.3 31.5 - 35.7 g/dL   RDW 04.5 40.9 - 81.1 %   Platelets 269 150 - 450 x10E3/uL   Neutrophils 44 Not Estab. %   Lymphs 42 Not Estab. %   Monocytes 11 Not Estab. %   Eos 2 Not Estab. %   Basos 1 Not Estab. %   Neutrophils Absolute 2.3 1.4 - 7.0 x10E3/uL   Lymphocytes Absolute 2.2 0.7 - 3.1 x10E3/uL   Monocytes Absolute 0.6 0.1 - 0.9 x10E3/uL   EOS (ABSOLUTE) 0.1 0.0 - 0.4 x10E3/uL   Basophils Absolute 0.1 0.0 - 0.2 x10E3/uL   Immature Granulocytes 0 Not Estab. %   Immature Grans (Abs) 0.0 0.0 - 0.1 x10E3/uL  Lipid Panel w/o Chol/HDL Ratio   Collection Time: 02/01/23  8:26 AM  Result Value Ref Range   Cholesterol, Total 188 100 - 199 mg/dL   Triglycerides 66 0 - 149 mg/dL   HDL 58 >91 mg/dL   VLDL Cholesterol Cal 12 5 - 40 mg/dL   LDL Chol Calc (NIH) 478 (H) 0 - 99 mg/dL  PSA   Collection Time: 02/01/23  8:26 AM  Result Value Ref Range   Prostate Specific Ag, Serum 0.4 0.0 - 4.0 ng/mL  TSH   Collection Time: 02/01/23  8:26 AM  Result Value Ref Range   TSH 1.850 0.450 - 4.500 uIU/mL      Assessment & Plan:   Problem List Items Addressed This Visit   None Visit Diagnoses       Welcome to Medicare preventive visit    -  Primary   Vaccines up to date/declined. Screening labs checked today. EKG normal. Colonoscopy up to date. Continue diet and exercise. Call with any concerns.   Relevant Orders   Comprehensive metabolic panel   [facility] [facility]  SCREENING     Screening for cardiovascular condition       EKG normal. Labs drawn today. Await results. Treat as needed.   Relevant Orders   Comprehensive metabolic panel   Lipid Panel w/o Chol/HDL Ratio   [facility] [employer] SCREENING   EKG 12-Lead     Screening for diabetes mellitus       Labs drawn today. Await results. Treat as needed.   Relevant Orders   Comprehensive metabolic panel   Bayer DCA Hb G9F Waived     Former smoker       AAA screening ordered. Continue to monitor.   Relevant Orders   Comprehensive metabolic panel   CBC with Differential/Platelet   [facility] [employer] SCREENING     Overweight       Continue diet and exercise. Call with any concerns.   Relevant Orders   Comprehensive metabolic panel   TSH     Hesitancy       Labs drawn today. Await results. Treat as needed.   Relevant Orders   PSA        Preventative Services:  Health Risk Assessment and Personalized Prevention Plan: Done today Bone Mass  Measurements: N/A CVD Screening: Done today Colon Cancer Screening: up to date Depression Screening: done today Diabetes Screening: done today Glaucoma Screening: see your eye doctor Hepatitis B vaccine: N/A Hepatitis C screening: up to date HIV Screening: up to date Flu Vaccine: up to date Lung cancer Screening: N/A Obesity Screening: Done today Pneumonia Vaccines: Declined STI Screening: N/A PSA screening: Done today  LABORATORY TESTING:  Health maintenance labs ordered today as discussed above.   The natural history of prostate cancer and ongoing controversy regarding screening and potential treatment outcomes of prostate cancer has been discussed with the patient. The meaning of a false positive PSA and a false negative PSA has been discussed. He indicates understanding of the limitations of this screening test and wishes to proceed with screening PSA testing.   IMMUNIZATIONS:   - Tdap: Tetanus vaccination status reviewed: last tetanus booster  within 10 years. - Influenza: Up to date - Pneumovax: Up to date - Prevnar: Refused - Zostavax vaccine: Up to date  SCREENING: - Colonoscopy: Up to date  Discussed with patient purpose of the colonoscopy is to detect colon cancer at curable precancerous or early stages   - AAA Screening: Ordered today  -Hearing Test: Up to date   PATIENT COUNSELING:    Sexuality: Discussed sexually transmitted diseases, partner selection, use of condoms, avoidance of unintended pregnancy  and contraceptive alternatives.   Advised to avoid cigarette smoking.  I discussed with the patient that most people either abstain from alcohol or drink within safe limits (<=14/week and <=4 drinks/occasion for males, <=7/weeks and <= 3 drinks/occasion for females) and that the risk for alcohol disorders and other health effects rises proportionally with the number of drinks per week and how often a drinker exceeds daily limits.  Discussed cessation/primary prevention of drug use and availability of treatment for abuse.   Diet: Encouraged to adjust caloric intake to maintain  or achieve ideal body weight, to reduce intake of dietary saturated fat and total fat, to limit sodium intake by avoiding high sodium foods and not adding table salt, and to maintain adequate dietary potassium and calcium preferably from fresh fruits, vegetables, and low-fat dairy products.    stressed the importance of regular exercise  Injury prevention: Discussed safety belts, safety helmets, smoke detector, smoking near bedding or upholstery.   Dental health: Discussed importance of regular tooth brushing, flossing, and dental visits.   Follow up plan: NEXT PREVENTATIVE PHYSICAL DUE IN 1 YEAR. Return in about 1 year (around 02/01/2025) for physical/Wellness.

## 2024-02-03 LAB — CBC WITH DIFFERENTIAL/PLATELET
Basophils Absolute: 0 10*3/uL (ref 0.0–0.2)
Basos: 1 %
EOS (ABSOLUTE): 0.1 10*3/uL (ref 0.0–0.4)
Eos: 2 %
Hematocrit: 43.2 % (ref 37.5–51.0)
Hemoglobin: 14.1 g/dL (ref 13.0–17.7)
Immature Grans (Abs): 0 10*3/uL (ref 0.0–0.1)
Immature Granulocytes: 0 %
Lymphocytes Absolute: 2 10*3/uL (ref 0.7–3.1)
Lymphs: 40 %
MCH: 31.3 pg (ref 26.6–33.0)
MCHC: 32.6 g/dL (ref 31.5–35.7)
MCV: 96 fL (ref 79–97)
Monocytes Absolute: 0.5 10*3/uL (ref 0.1–0.9)
Monocytes: 10 %
Neutrophils Absolute: 2.4 10*3/uL (ref 1.4–7.0)
Neutrophils: 47 %
Platelets: 260 10*3/uL (ref 150–450)
RBC: 4.51 x10E6/uL (ref 4.14–5.80)
RDW: 13.1 % (ref 11.6–15.4)
WBC: 5 10*3/uL (ref 3.4–10.8)

## 2024-02-03 LAB — COMPREHENSIVE METABOLIC PANEL
ALT: 9 IU/L (ref 0–44)
AST: 17 IU/L (ref 0–40)
Albumin: 4.4 g/dL (ref 3.9–4.9)
Alkaline Phosphatase: 57 IU/L (ref 44–121)
BUN/Creatinine Ratio: 14 (ref 10–24)
BUN: 13 mg/dL (ref 8–27)
Bilirubin Total: 0.6 mg/dL (ref 0.0–1.2)
CO2: 23 mmol/L (ref 20–29)
Calcium: 9 mg/dL (ref 8.6–10.2)
Chloride: 102 mmol/L (ref 96–106)
Creatinine, Ser: 0.95 mg/dL (ref 0.76–1.27)
Globulin, Total: 2.4 g/dL (ref 1.5–4.5)
Glucose: 85 mg/dL (ref 70–99)
Potassium: 4.3 mmol/L (ref 3.5–5.2)
Sodium: 139 mmol/L (ref 134–144)
Total Protein: 6.8 g/dL (ref 6.0–8.5)
eGFR: 89 mL/min/{1.73_m2} (ref >59)

## 2024-02-03 LAB — PSA: Prostate Specific Ag, Serum: 0.3 ng/mL (ref 0.0–4.0)

## 2024-02-03 LAB — LIPID PANEL W/O CHOL/HDL RATIO
Cholesterol, Total: 208 mg/dL — ABNORMAL HIGH (ref 100–199)
HDL: 60 mg/dL (ref >39)
LDL Chol Calc (NIH): 138 mg/dL — ABNORMAL HIGH (ref 0–99)
Triglycerides: 57 mg/dL (ref 0–149)
VLDL Cholesterol Cal: 10 mg/dL (ref 5–40)

## 2024-02-03 LAB — TSH: TSH: 2.35 u[IU]/mL (ref 0.450–4.500)

## 2024-02-04 ENCOUNTER — Encounter: Payer: Self-pay | Admitting: Family Medicine

## 2024-02-08 ENCOUNTER — Encounter: Payer: Self-pay | Admitting: Family Medicine

## 2024-02-08 ENCOUNTER — Ambulatory Visit
Admission: RE | Admit: 2024-02-08 | Discharge: 2024-02-08 | Disposition: A | Discharge status: Home / Self Care | Source: Ambulatory Visit | Attending: Family Medicine | Admitting: Family Medicine

## 2024-02-08 DIAGNOSIS — Z0000 Encounter for general adult medical examination without abnormal findings: Secondary | ICD-10-CM | POA: Insufficient documentation

## 2024-02-08 DIAGNOSIS — Z87891 Personal history of nicotine dependence: Secondary | ICD-10-CM | POA: Diagnosis not present

## 2024-02-08 DIAGNOSIS — Z136 Encounter for screening for cardiovascular disorders: Secondary | ICD-10-CM | POA: Diagnosis not present

## 2024-02-21 ENCOUNTER — Telehealth: Payer: Self-pay | Admitting: Family Medicine

## 2024-02-21 NOTE — Telephone Encounter (Signed)
 Copied from CRM [id number]. Topic: General - Billing Inquiry >> Feb 21, 2024  9:17 AM Fuller Mandril wrote: Reason for CRM: Patient wife called states she has spoken with billing and they said they are not responsible for the codes providers use. Patient received a rejection from insurance from Welcome to medicare visit. Rejection Code: [id number] - Visual Acuity Screen - why would this be billed if not covered. Call back: [phone]. Thank You

## 2024-02-23 DIAGNOSIS — J069 Acute upper respiratory infection, unspecified: Secondary | ICD-10-CM | POA: Diagnosis not present

## 2024-02-23 DIAGNOSIS — R509 Fever, unspecified: Secondary | ICD-10-CM | POA: Diagnosis not present

## 2024-02-23 DIAGNOSIS — J3489 Other specified disorders of nose and nasal sinuses: Secondary | ICD-10-CM | POA: Diagnosis not present

## 2024-03-03 NOTE — Telephone Encounter (Signed)
 Spoke with Thomas Whitney regarding billing concerns.  Patient's wife acknowledged understanding.

## 2024-05-16 DIAGNOSIS — K080 Exfoliation of teeth due to systemic causes: Secondary | ICD-10-CM | POA: Diagnosis not present

## 2024-06-12 DIAGNOSIS — H35361 Drusen (degenerative) of macula, right eye: Secondary | ICD-10-CM | POA: Diagnosis not present

## 2024-06-12 DIAGNOSIS — H02884 Meibomian gland dysfunction left upper eyelid: Secondary | ICD-10-CM | POA: Diagnosis not present

## 2024-06-12 DIAGNOSIS — H02881 Meibomian gland dysfunction right upper eyelid: Secondary | ICD-10-CM | POA: Diagnosis not present

## 2024-06-12 DIAGNOSIS — H2513 Age-related nuclear cataract, bilateral: Secondary | ICD-10-CM | POA: Diagnosis not present

## 2024-07-13 DIAGNOSIS — M9902 Segmental and somatic dysfunction of thoracic region: Secondary | ICD-10-CM | POA: Diagnosis not present

## 2024-07-13 DIAGNOSIS — M9901 Segmental and somatic dysfunction of cervical region: Secondary | ICD-10-CM | POA: Diagnosis not present

## 2024-07-13 DIAGNOSIS — M9904 Segmental and somatic dysfunction of sacral region: Secondary | ICD-10-CM | POA: Diagnosis not present

## 2024-07-13 DIAGNOSIS — M9903 Segmental and somatic dysfunction of lumbar region: Secondary | ICD-10-CM | POA: Diagnosis not present

## 2025-02-05 ENCOUNTER — Encounter: Admitting: Family Medicine
